# Patient Record
Sex: Female | Born: 1990 | Race: Black or African American | Hispanic: No | Marital: Single | Smoking: Current every day smoker
Health system: Southern US, Community
[De-identification: ages and names within clinical notes are randomized; demographics above are authoritative.]

## PROBLEM LIST (undated history)

## (undated) DIAGNOSIS — I10 Essential (primary) hypertension: Secondary | ICD-10-CM

---

## 2018-04-29 ENCOUNTER — Encounter (HOSPITAL_COMMUNITY): Payer: Self-pay | Admitting: *Deleted

## 2018-04-29 ENCOUNTER — Emergency Department (HOSPITAL_COMMUNITY)
Admission: EM | Admit: 2018-04-29 | Discharge: 2018-04-29 | Disposition: A | Discharge status: Home / Self Care | Payer: Self-pay | Attending: Emergency Medicine | Admitting: Emergency Medicine

## 2018-04-29 DIAGNOSIS — I10 Essential (primary) hypertension: Secondary | ICD-10-CM | POA: Insufficient documentation

## 2018-04-29 DIAGNOSIS — J069 Acute upper respiratory infection, unspecified: Secondary | ICD-10-CM | POA: Insufficient documentation

## 2018-04-29 HISTORY — DX: Essential (primary) hypertension: I10

## 2018-04-29 LAB — GROUP A STREP BY PCR: Group A Strep by PCR: NOT DETECTED

## 2018-04-29 MED ORDER — FLUTICASONE PROPIONATE 50 MCG/ACT NA SUSP: 1.0000 | Freq: Every day | NASAL | 0 refills | Status: DC

## 2018-04-29 MED ORDER — CLONIDINE HCL 0.2 MG PO TABS
0.2000 mg | ORAL_TABLET | Freq: Once | ORAL | Status: AC
  Administered 2018-04-29: 0.2 mg via ORAL
  Filled 2018-04-29: qty 1

## 2018-04-29 MED ORDER — BENZONATATE 100 MG PO CAPS: 100.0000 mg | ORAL_CAPSULE | Freq: Three times a day (TID) | ORAL | 0 refills | Status: DC | PRN

## 2018-04-29 MED ORDER — IBUPROFEN 800 MG PO TABS: 800.0000 mg | ORAL_TABLET | Freq: Three times a day (TID) | ORAL | 0 refills | Status: DC

## 2018-04-29 NOTE — Discharge Instructions (Addendum)
You were seen in the emergency today for upper respiratory symptoms, we suspect your symptoms are related to a virus at this time. Your strep test was negative. We have prescribed you multiple medications to treat your symptoms.   -Flonase to be used 1 spray in each nostril daily.  This medication is used to treat your congestion.  -Tessalon can be taken once every 8 hours as needed.  This medication is used to treat your cough.  -Ibuprofen to be taken once every 8 hours as needed for pain. Please take this medicine with food as it can cause stomach upset and at worst stomach bleeding. Do not take other NSAIDs such as motrin, aleve, advil, naproxen, mobic, etc as they are similar. You make take tylenol per over the counter dosing with this medicine safely.  We have prescribed you new medication(s) today. Discuss the medications prescribed today with your pharmacist as they can have adverse effects and interactions with your other medicines including over the counter and prescribed medications. Seek medical evaluation if you start to experience new or abnormal symptoms after taking one of these medicines, seek care immediately if you start to experience difficulty breathing, feeling of your throat closing, facial swelling, or rash as these could be indications of a more serious allergic reaction  You will need to follow-up with your primary care provider in 1 week if your symptoms have not improved.  If you do not have a primary care provider one is provided in your discharge instructions.  Return to the emergency department for any new or worsening symptoms including but not limited to persistent fever for 5 days, difficulty breathing, chest pain, rashes, passing out, or any other concerns.    Additionally your blood pressure was elevated in the ER- please take your hydrochlorothiazide as prescribed. Follow up with primary care within 1 week for recheck of this.  Return to the ER for new or worsening  symptoms including but not limited to acute worsening of headache, dizziness, numbness, weakness, change in vision, chest pain, or any other concerns.

## 2018-04-29 NOTE — ED Triage Notes (Signed)
Pt is here for sore throat x2 days.  Tonsils are enlarged.  Pt denies any fever or chills.  Pt is hypertensive in triage.  She is not taking her BP meds.  Pt denies any symtoms from her HTN

## 2018-04-29 NOTE — ED Notes (Signed)
Patient verbalizes understanding of discharge instructions. Opportunity for questioning and answers were provided. Armband removed by staff, pt discharged from ED ambulatory to home.  

## 2018-04-29 NOTE — ED Provider Notes (Signed)
[facility]   CSN: [id number] Arrival date & time: 04/29/18  1201     History   Chief Complaint Chief Complaint  Patient presents with  . Sore Throat  . Hypertension    HPI Bethany Ball is a 28 y.o. female with a hx of HTN who presents to the ED with complaints of sore throat and associated URI symptoms for the past 2 to 3 days.  Patient states she is having congestion, sore throat, some right ear discomfort, productive cough with mucus sputum.  Current discomfort is a 5 out of 10 in severity.  No specific alleviating or aggravating factors.  Has tried OTC cold/flu medicine without significant relief.  Denies fever, chills, chest pain, dyspnea, or vomiting.  Patient noted to be hypertensive upon arrival to the emergency department.  She states that she is prescribed hydrochlorothiazide.  She has not been taking it for several days as she "does not like to take her blood pressure medicine with cold medicine as it scares her."  States she has had some mild headache over the past few days, gradual onset, steady progression, similar to prior headaches.  She denies change in vision, numbness, tingling, weakness, dizziness, chest pain, dyspnea, or abdominal pain.  She states her blood pressure typically runs in the 200s over 100s.  HPI  Past Medical History:  Diagnosis Date  . Hypertension    does not take her meds that are prescribed    There are no active problems to display for this patient.   History reviewed. No pertinent surgical history.   OB History   No obstetric history on file.      Home Medications    Prior to Admission medications   Not on File    Family History No family history on file.  Social History Social History   Tobacco Use  . Smoking status: Not on file  . Smokeless tobacco: Never Used  Substance Use Topics  . Alcohol use: Never    Frequency: Never  . Drug use: Never      Allergies   Patient has no known allergies.   Review of Systems Review of Systems  Constitutional: Negative for chills and fever.  HENT: Positive for congestion, ear pain and sore throat. Negative for trouble swallowing.   Eyes: Negative for visual disturbance.  Respiratory: Positive for cough. Negative for shortness of breath.   Cardiovascular: Negative for chest pain.  Gastrointestinal: Negative for abdominal pain.  Neurological: Positive for headaches. Negative for dizziness, syncope, weakness and numbness.  All other systems reviewed and are negative.    Physical Exam Updated Vital Signs BP (!) 220/136 (BP Location: Left Arm)   Pulse 92   Temp 98.2 F (36.8 C) (Oral)   Resp 16   Ht 5\' 7"  (1.702 m)   Wt 108.9 kg   LMP 04/18/2018   SpO2 100%   BMI 37.59 kg/m   Physical Exam Vitals signs and nursing note reviewed.  Constitutional:      General: She is not in acute distress.    Appearance: She is well-developed.  HENT:     Head: Normocephalic and atraumatic.     Right Ear: Tympanic membrane normal. Tympanic membrane is not perforated, erythematous, retracted or bulging.     Left Ear: Tympanic membrane normal. Tympanic membrane is not perforated, erythematous, retracted or bulging.     Nose: No congestion.     Mouth/Throat:     Pharynx: Uvula midline. Posterior  oropharyngeal erythema present. No oropharyngeal exudate.     Tonsils: Swelling: 1+ on the right. 1+ on the left.     Comments: Posterior oropharynx is symmetric appearing. Patient tolerating own secretions without difficulty. No trismus. No drooling. No hot potato voice. No swelling beneath the tongue, submandibular compartment is soft.  Eyes:     General:        Right eye: No discharge.        Left eye: No discharge.     Conjunctiva/sclera: Conjunctivae normal.     Pupils: Pupils are equal, round, and reactive to light.  Neck:     Musculoskeletal: Normal range of motion and neck supple. No edema or  neck rigidity.  Cardiovascular:     Rate and Rhythm: Normal rate and regular rhythm.     Heart sounds: No murmur.  Pulmonary:     Effort: No respiratory distress.     Breath sounds: Normal breath sounds. No wheezing or rales.  Abdominal:     General: There is no distension.     Palpations: Abdomen is soft.     Tenderness: There is no abdominal tenderness.  Lymphadenopathy:     Cervical: No cervical adenopathy.  Skin:    General: Skin is warm and dry.     Findings: No rash.  Neurological:     Mental Status: She is alert.     Comments: Clear speech.  CN III through XII grossly intact.  Sensation grossly intact bilateral upper and lower extremities.  5 out of 5 symmetric grip strength.  Negative pronator drift.  Normal finger-to-nose.  Ambulatory with steady gait.  Psychiatric:        Behavior: Behavior normal.    ED Treatments / Results  Labs (all labs ordered are listed, but only abnormal results are displayed) Labs Reviewed  GROUP A STREP BY PCR    EKG None  Radiology No results found.  Procedures Procedures (including critical care time)  Medications Ordered in ED Medications - No data to display   Initial Impression / Assessment and Plan / ED Course  I have reviewed the triage vital signs and the nursing notes.  Pertinent labs & imaging results that were available during my care of the patient were reviewed by me and considered in my medical decision making (see chart for details).    Patient presents with URI type symptoms.  Patient is nontoxic appearing, in no apparent distress, vitals notable for hypertension. Patient has hx of HTN, not taking her prescribed hydrochlorothiazide. She has a mild headache similar to prior w/ no neuro deficits doubt ICH. No numbness, weakness, visual disturbance, dizziness, chest pain, or dyspnea. Doubt HTN emergency. Discussed with supervising physician Dr. Rodena Medin- recommend 0.2 mg of Clonidine- this was administered w/ improvement  in BP. Encouraged patient to take her hydrochlorothiazide as prescribed & follow up with PCP regarding this matter.   Regarding URI sxs: Patient is afebrile in the ED, lungs are CTA, doubt pneumonia. There is no wheezing or signs of respiratory distress. Sxs onset < 7 days, afebrile, no sinus tenderness, doubt acute bacterial sinusitis. Strep negative, no evidence of RPA/PTA. No evidence of AOM on exam. No meningeal signs.  Suspect viral, will tx supportively with  Ibuprofen, Flonase, and Tessalon. I discussed results, treatment plan, need for PCP follow-up, and return precautions with the patient. Provided opportunity for questions, patient confirmed understanding and is in agreement with plan.   Vitals:   04/29/18 1630 04/29/18 1748  BP: (!) 210/164 Marland Kitchen)  164/123  Pulse:  69  Resp:  16  Temp:    SpO2:  98%     Final Clinical Impressions(s) / ED Diagnoses   Final diagnoses:  Viral URI  Hypertension, unspecified type    ED Discharge Orders         Ordered    fluticasone (FLONASE) 50 MCG/ACT nasal spray  Daily,   Status:  Discontinued     04/29/18 1745    benzonatate (TESSALON) 100 MG capsule  3 times daily PRN,   Status:  Discontinued     04/29/18 1745    ibuprofen (ADVIL,MOTRIN) 800 MG tablet  3 times daily,   Status:  Discontinued     04/29/18 1745    benzonatate (TESSALON) 100 MG capsule  3 times daily PRN     04/29/18 1756    fluticasone (FLONASE) 50 MCG/ACT nasal spray  Daily     04/29/18 1756    ibuprofen (ADVIL,MOTRIN) 800 MG tablet  3 times daily     04/29/18 1756           Trea Latner, Antigo R, PA-C 04/29/18 1846    Wynetta Fines, MD 05/02/18 1525

## 2018-04-30 ENCOUNTER — Ambulatory Visit (HOSPITAL_COMMUNITY)
Admission: EM | Admit: 2018-04-30 | Discharge: 2018-04-30 | Disposition: A | Discharge status: Home / Self Care | Payer: Self-pay | Attending: Family Medicine | Admitting: Family Medicine

## 2018-04-30 ENCOUNTER — Encounter (HOSPITAL_COMMUNITY): Payer: Self-pay | Admitting: Emergency Medicine

## 2018-04-30 DIAGNOSIS — I10 Essential (primary) hypertension: Secondary | ICD-10-CM

## 2018-04-30 MED ORDER — AMLODIPINE BESYLATE 5 MG PO TABS: 5.0000 mg | ORAL_TABLET | Freq: Every day | ORAL | 2 refills | Status: DC

## 2018-04-30 MED ORDER — ACETAMINOPHEN 500 MG PO TABS: 500.0000 mg | ORAL_TABLET | Freq: Four times a day (QID) | ORAL | 0 refills | Status: DC | PRN

## 2018-04-30 MED ORDER — CLONIDINE HCL 0.1 MG PO TABS
0.2000 mg | ORAL_TABLET | Freq: Once | ORAL | Status: AC
  Administered 2018-04-30: 0.2 mg via ORAL

## 2018-04-30 MED ORDER — CLONIDINE HCL 0.1 MG PO TABS
ORAL_TABLET | ORAL | Status: AC
  Filled 2018-04-30: qty 2

## 2018-04-30 MED ORDER — CYCLOBENZAPRINE HCL 10 MG PO TABS: 10.0000 mg | ORAL_TABLET | Freq: Two times a day (BID) | ORAL | 0 refills | Status: DC | PRN
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
## 2021-06-12 ENCOUNTER — Encounter (HOSPITAL_COMMUNITY): Payer: Self-pay

## 2021-06-12 ENCOUNTER — Emergency Department (HOSPITAL_COMMUNITY): Payer: Self-pay

## 2021-06-12 ENCOUNTER — Inpatient Hospital Stay (HOSPITAL_COMMUNITY)
Admission: EM | Admit: 2021-06-12 | Discharge: 2021-06-15 | DRG: 683 | Disposition: A | Discharge status: Home / Self Care | Payer: Self-pay | Attending: Internal Medicine | Admitting: Internal Medicine

## 2021-06-12 ENCOUNTER — Other Ambulatory Visit: Payer: Self-pay

## 2021-06-12 DIAGNOSIS — Z8249 Family history of ischemic heart disease and other diseases of the circulatory system: Secondary | ICD-10-CM

## 2021-06-12 DIAGNOSIS — N189 Chronic kidney disease, unspecified: Secondary | ICD-10-CM | POA: Diagnosis present

## 2021-06-12 DIAGNOSIS — T447X6A Underdosing of beta-adrenoreceptor antagonists, initial encounter: Secondary | ICD-10-CM | POA: Diagnosis present

## 2021-06-12 DIAGNOSIS — H0520 Unspecified exophthalmos: Secondary | ICD-10-CM | POA: Diagnosis present

## 2021-06-12 DIAGNOSIS — I160 Hypertensive urgency: Secondary | ICD-10-CM

## 2021-06-12 DIAGNOSIS — T465X5A Adverse effect of other antihypertensive drugs, initial encounter: Secondary | ICD-10-CM | POA: Diagnosis not present

## 2021-06-12 DIAGNOSIS — I129 Hypertensive chronic kidney disease with stage 1 through stage 4 chronic kidney disease, or unspecified chronic kidney disease: Principal | ICD-10-CM | POA: Diagnosis present

## 2021-06-12 DIAGNOSIS — R42 Dizziness and giddiness: Secondary | ICD-10-CM | POA: Diagnosis not present

## 2021-06-12 DIAGNOSIS — Z560 Unemployment, unspecified: Secondary | ICD-10-CM

## 2021-06-12 DIAGNOSIS — E669 Obesity, unspecified: Secondary | ICD-10-CM | POA: Diagnosis present

## 2021-06-12 DIAGNOSIS — N179 Acute kidney failure, unspecified: Secondary | ICD-10-CM | POA: Diagnosis present

## 2021-06-12 DIAGNOSIS — Z91128 Patient's intentional underdosing of medication regimen for other reason: Secondary | ICD-10-CM

## 2021-06-12 DIAGNOSIS — Z9114 Patient's other noncompliance with medication regimen: Secondary | ICD-10-CM

## 2021-06-12 DIAGNOSIS — Y92230 Patient room in hospital as the place of occurrence of the external cause: Secondary | ICD-10-CM | POA: Diagnosis not present

## 2021-06-12 DIAGNOSIS — I10 Essential (primary) hypertension: Principal | ICD-10-CM | POA: Diagnosis present

## 2021-06-12 DIAGNOSIS — E250 Congenital adrenogenital disorders associated with enzyme deficiency: Secondary | ICD-10-CM | POA: Diagnosis present

## 2021-06-12 DIAGNOSIS — E785 Hyperlipidemia, unspecified: Secondary | ICD-10-CM | POA: Diagnosis present

## 2021-06-12 DIAGNOSIS — Z6834 Body mass index (BMI) 34.0-34.9, adult: Secondary | ICD-10-CM

## 2021-06-12 LAB — MAGNESIUM: Magnesium: 2 mg/dL (ref 1.7–2.4)

## 2021-06-12 LAB — COMPREHENSIVE METABOLIC PANEL
ALT: 14 U/L (ref 0–44)
AST: 19 U/L (ref 15–41)
Albumin: 4 g/dL (ref 3.5–5.0)
Alkaline Phosphatase: 69 U/L (ref 38–126)
Anion gap: 13 (ref 5–15)
BUN: 16 mg/dL (ref 6–20)
CO2: 23 mmol/L (ref 22–32)
Calcium: 9.6 mg/dL (ref 8.9–10.3)
Chloride: 103 mmol/L (ref 98–111)
Creatinine, Ser: 1.31 mg/dL — ABNORMAL HIGH (ref 0.44–1.00)
GFR, Estimated: 56 mL/min — ABNORMAL LOW (ref >60)
Glucose, Bld: 107 mg/dL — ABNORMAL HIGH (ref 70–99)
Potassium: 3.6 mmol/L (ref 3.5–5.1)
Sodium: 139 mmol/L (ref 135–145)
Total Bilirubin: 0.2 mg/dL — ABNORMAL LOW (ref 0.3–1.2)
Total Protein: 7.7 g/dL (ref 6.5–8.1)

## 2021-06-12 LAB — LIPID PANEL
Cholesterol: 229 mg/dL — ABNORMAL HIGH (ref 0–200)
HDL: 43 mg/dL (ref >40)
LDL Cholesterol: 156 mg/dL — ABNORMAL HIGH (ref 0–99)
Total CHOL/HDL Ratio: 5.3 RATIO
Triglycerides: 152 mg/dL — ABNORMAL HIGH (ref <150)
VLDL: 30 mg/dL (ref 0–40)

## 2021-06-12 LAB — CBC WITH DIFFERENTIAL/PLATELET
Abs Immature Granulocytes: 0.03 10*3/uL (ref 0.00–0.07)
Basophils Absolute: 0.1 10*3/uL (ref 0.0–0.1)
Basophils Relative: 1 %
Eosinophils Absolute: 0.2 10*3/uL (ref 0.0–0.5)
Eosinophils Relative: 2 %
HCT: 41.3 % (ref 36.0–46.0)
Hemoglobin: 12.8 g/dL (ref 12.0–15.0)
Immature Granulocytes: 0 %
Lymphocytes Relative: 25 %
Lymphs Abs: 2.4 10*3/uL (ref 0.7–4.0)
MCH: 27.9 pg (ref 26.0–34.0)
MCHC: 31 g/dL (ref 30.0–36.0)
MCV: 90.2 fL (ref 80.0–100.0)
Monocytes Absolute: 0.7 10*3/uL (ref 0.1–1.0)
Monocytes Relative: 8 %
Neutro Abs: 6.5 10*3/uL (ref 1.7–7.7)
Neutrophils Relative %: 64 %
Platelets: 303 10*3/uL (ref 150–400)
RBC: 4.58 MIL/uL (ref 3.87–5.11)
RDW: 14.3 % (ref 11.5–15.5)
WBC: 9.9 10*3/uL (ref 4.0–10.5)
nRBC: 0 % (ref 0.0–0.2)

## 2021-06-12 LAB — TROPONIN I (HIGH SENSITIVITY)
Troponin I (High Sensitivity): 15 ng/L (ref <18)
Troponin I (High Sensitivity): 16 ng/L (ref <18)

## 2021-06-12 LAB — HIV ANTIBODY (ROUTINE TESTING W REFLEX): HIV Screen 4th Generation wRfx: NONREACTIVE

## 2021-06-12 LAB — HEMOGLOBIN A1C
Hgb A1c MFr Bld: 5 % (ref 4.8–5.6)
Mean Plasma Glucose: 96.8 mg/dL

## 2021-06-12 LAB — TSH: TSH: 1.158 u[IU]/mL (ref 0.350–4.500)

## 2021-06-12 LAB — I-STAT BETA HCG BLOOD, ED (MC, WL, AP ONLY): I-stat hCG, quantitative: <5 m[IU]/mL (ref <5)

## 2021-06-12 LAB — PHOSPHORUS: Phosphorus: 4.3 mg/dL (ref 2.5–4.6)

## 2021-06-12 MED ORDER — ONDANSETRON HCL 4 MG PO TABS: 4.0000 mg | ORAL_TABLET | Freq: Four times a day (QID) | ORAL | Status: DC | PRN

## 2021-06-12 MED ORDER — LABETALOL HCL 5 MG/ML IV SOLN
20.0000 mg | Freq: Once | INTRAVENOUS | Status: AC
  Administered 2021-06-12: 20 mg via INTRAVENOUS
  Filled 2021-06-12: qty 4

## 2021-06-12 MED ORDER — LABETALOL HCL 5 MG/ML IV SOLN
10.0000 mg | INTRAVENOUS | Status: DC | PRN
  Administered 2021-06-12 (×2): 10 mg via INTRAVENOUS
  Administered 2021-06-12: 20 mg via INTRAVENOUS
  Administered 2021-06-12 – 2021-06-13 (×3): 10 mg via INTRAVENOUS
  Administered 2021-06-13: 20 mg via INTRAVENOUS
  Administered 2021-06-13 (×2): 10 mg via INTRAVENOUS
  Administered 2021-06-14: 15 mg via INTRAVENOUS
  Administered 2021-06-14: 20 mg via INTRAVENOUS
  Administered 2021-06-14 (×2): 10 mg via INTRAVENOUS
  Administered 2021-06-15: 20 mg via INTRAVENOUS
  Filled 2021-06-12 (×15): qty 4

## 2021-06-12 MED ORDER — ENOXAPARIN SODIUM 40 MG/0.4ML IJ SOSY
40.0000 mg | PREFILLED_SYRINGE | INTRAMUSCULAR | Status: DC
  Administered 2021-06-13 – 2021-06-14 (×2): 40 mg via SUBCUTANEOUS
  Filled 2021-06-12 (×3): qty 0.4

## 2021-06-12 MED ORDER — ACETAMINOPHEN 650 MG RE SUPP: 650.0000 mg | Freq: Four times a day (QID) | RECTAL | Status: DC | PRN

## 2021-06-12 MED ORDER — ONDANSETRON HCL 4 MG/2ML IJ SOLN: 4.0000 mg | Freq: Four times a day (QID) | INTRAMUSCULAR | Status: DC | PRN

## 2021-06-12 MED ORDER — LABETALOL HCL 5 MG/ML IV SOLN: 5.0000 mg | INTRAVENOUS | Status: DC | PRN

## 2021-06-12 MED ORDER — ACETAMINOPHEN 325 MG PO TABS
650.0000 mg | ORAL_TABLET | Freq: Four times a day (QID) | ORAL | Status: DC | PRN
  Administered 2021-06-12 – 2021-06-15 (×2): 650 mg via ORAL
  Filled 2021-06-12 (×2): qty 2

## 2021-06-12 MED ORDER — ACETAMINOPHEN 500 MG PO TABS
1000.0000 mg | ORAL_TABLET | Freq: Once | ORAL | Status: AC
  Administered 2021-06-12: 1000 mg via ORAL
  Filled 2021-06-12: qty 2

## 2021-06-12 MED ORDER — POLYETHYLENE GLYCOL 3350 17 G PO PACK: 17.0000 g | PACK | Freq: Every day | ORAL | Status: DC | PRN

## 2021-06-12 MED ORDER — HYDROCHLOROTHIAZIDE 25 MG PO TABS
25.0000 mg | ORAL_TABLET | Freq: Every day | ORAL | Status: DC
  Administered 2021-06-12 – 2021-06-15 (×4): 25 mg via ORAL
  Filled 2021-06-12 (×4): qty 1

## 2021-06-12 MED ORDER — SODIUM CHLORIDE 0.9% FLUSH
3.0000 mL | Freq: Two times a day (BID) | INTRAVENOUS | Status: DC
  Administered 2021-06-12 – 2021-06-15 (×7): 3 mL via INTRAVENOUS

## 2021-06-12 MED ORDER — AMLODIPINE BESYLATE 10 MG PO TABS
10.0000 mg | ORAL_TABLET | Freq: Every day | ORAL | Status: DC
  Administered 2021-06-12 – 2021-06-15 (×4): 10 mg via ORAL
  Filled 2021-06-12: qty 1
  Filled 2021-06-12: qty 2
  Filled 2021-06-12 (×2): qty 1

## 2021-06-12 NOTE — Hospital Course (Signed)
HTN emergency -  ?- frontal headache  ?- AKI  ?- mild elevated BNP ?- CT scan  ?- labetalol  ?-  ?

## 2021-06-12 NOTE — ED Triage Notes (Signed)
Went for new employment physical and was told her BP was too high for clearance and need ER treatment. 235/159. Hx HTN not taking any medications x 2 years. Denies any sx.  ?

## 2021-06-12 NOTE — Progress Notes (Signed)
Patient admitted with elevated BP. Will administer PRN meds and increase vital signs frequency ? ? 06/12/21 1745  ?Assess: MEWS Score  ?Temp 98.3 ?F (36.8 ?C)  ?BP (!) 210/132  ?Pulse Rate 79  ?Resp 18  ?SpO2 100 %  ?O2 Device Room Air  ?Assess: MEWS Score  ?MEWS Temp 0  ?MEWS Systolic 2  ?MEWS Pulse 0  ?MEWS RR 0  ?MEWS LOC 0  ?MEWS Score 2  ?MEWS Score Color Yellow  ?Assess: if the MEWS score is Yellow or Red  ?Were vital signs taken at a resting state? Yes  ?Focused Assessment No change from prior assessment  ?Early Detection of Sepsis Score *See Row Information* Low  ?MEWS guidelines implemented *See Row Information* Yes  ?Treat  ?Pain Scale 0-10  ?Pain Score 4  ?Pain Location Head  ?Pain Descriptors / Indicators Headache  ?Take Vital Signs  ?Increase Vital Sign Frequency  Yellow: Q 2hr X 2 then Q 4hr X 2, if remains yellow, continue Q 4hrs  ?Escalate  ?MEWS: Escalate Yellow: discuss with charge nurse/RN and consider discussing with provider and RRT  ?Notify: Charge Nurse/RN  ?Name of Charge Nurse/RN Notified French Ana RN  ?Date Charge Nurse/RN Notified 06/12/21  ?Time Charge Nurse/RN Notified 1748  ?Document  ?Patient Outcome Other (Comment)  ?Progress note created (see row info) Yes  ? ? ?

## 2021-06-12 NOTE — ED Provider Notes (Addendum)
?[facility] ?Provider Note ? ? ?CSN: [id number] ?Arrival date & time: 06/12/21  7253 ? ?  ? ?History ? ?Chief Complaint  ?Patient presents with  ? Hypertension  ? ? ?B[facility] is a 31 y.o. female. ? ?[facility] is a 31 yr old female with PMH of hypertension presents today with severely elevated blood pressures. She went for her employment physical yesterday and was told her BP was too high for clearance. Initial BP was 259/159. Patient reports not taking any antihypertensives for the last 2 years but has been prescribed amlodipine in the past prescribed by urgent care a few years ago, she never started it. She was taking "water pills" when she was young for HTN around age of 32. Has taken another medication starting "M" approx 10 years ago but stopped because she felt dizzy. Patient denies headache, vision, chest pain, drowsiness etc. Eating and drinking and passing urine well.   ? ? ?  ? ?Home Medications ?Prior to Admission medications   ?Medication Sig Start Date End Date Taking? Authorizing Provider  ?Aspirin-Salicylamide-Caffeine (BC HEADACHE PO) Take 1 packet by mouth daily as needed (headaches/pain).   Yes [provider]  ?amLODipine (NORVASC) 5 MG tablet Take 1 tablet (5 mg total) by mouth daily. ?Patient not taking: Reported on 06/12/2021 04/30/18   Bethany Aris, NP  ?   ? ?Allergies    ?Patient has no known allergies.   ? ?Review of Systems   ?Review of Systems  ?Constitutional: Negative.   ?HENT: Negative.    ?Eyes: Negative.   ?Respiratory: Negative.    ?Cardiovascular: Negative.   ?Gastrointestinal: Negative.   ?Endocrine: Negative.   ?Musculoskeletal: Negative.   ?Neurological: Negative.   ?Hematological: Negative.   ?Psychiatric/Behavioral: Negative.    ? ?Physical Exam ?Updated Vital Signs ?BP (!) 195/127   Pulse 64   Temp 98 ?F (36.7 ?C) (Oral)   Resp 15   Ht 5\' 7"  (1.702 m)   Wt 99.8 kg   SpO2 98%   BMI 34.46 kg/m?  ?Physical  Exam ?Constitutional:   ?   General: She is not in acute distress. ?   Appearance: Normal appearance. She is obese. She is not ill-appearing or toxic-appearing.  ?HENT:  ?   Head: Normocephalic and atraumatic.  ?   Nose: Nose normal.  ?   Mouth/Throat:  ?   Mouth: Mucous membranes are moist.  ?Eyes:  ?   Extraocular Movements: Extraocular movements intact.  ?   Pupils: Pupils are equal, round, and reactive to light.  ?Cardiovascular:  ?   Rate and Rhythm: Normal rate and regular rhythm.  ?   Heart sounds: Normal heart sounds, S1 normal and S2 normal.  ?Pulmonary:  ?   Effort: Pulmonary effort is normal.  ?   Breath sounds: Normal breath sounds.  ?Abdominal:  ?   General: Abdomen is flat. Bowel sounds are normal.  ?   Palpations: Abdomen is soft.  ?Musculoskeletal:  ?   Cervical back: Normal range of motion and neck supple.  ?Skin: ?   General: Skin is warm and dry.  ?Neurological:  ?   General: No focal deficit present.  ?   Mental Status: She is alert. Mental status is at baseline.  ?   Sensory: Sensation is intact.  ?   Motor: Motor function is intact.  ?Psychiatric:     ?   Mood and Affect: Mood normal.     ?   Behavior:  Behavior normal.  ? ? ?ED Results / Procedures / Treatments   ?Labs ?(all labs ordered are listed, but only abnormal results are displayed) ?Labs Reviewed  ?COMPREHENSIVE METABOLIC PANEL - Abnormal; Notable for the following components:  ?    Result Value  ? Glucose, Bld 107 (*)   ? Creatinine, Ser 1.31 (*)   ? Total Bilirubin 0.2 (*)   ? GFR, Estimated 56 (*)   ? All other components within normal limits  ?CBC WITH DIFFERENTIAL/PLATELET  ?I-STAT BETA HCG BLOOD, ED ([facility], [facility], [facility] ONLY)  ?TROPONIN I (HIGH SENSITIVITY)  ?TROPONIN I (HIGH SENSITIVITY)  ? ? ?EKG ?None ? ?Radiology ?No results found. ? ?Procedures ?Procedures  ? ? ?Medications Ordered in ED ?Medications  ?hydrochlorothiazide (HYDRODIURIL) tablet 25 mg (25 mg Oral Given 06/12/21 1152)  ?labetalol (NORMODYNE) injection 5 mg (has no  administration in time range)  ?labetalol (NORMODYNE) injection 20 mg (20 mg Intravenous Given 06/12/21 1045)  ?labetalol (NORMODYNE) injection 20 mg (20 mg Intravenous Given 06/12/21 1152)  ?acetaminophen (TYLENOL) tablet 1,000 mg (1,000 mg Oral Given 06/12/21 1202)  ? ? ?ED Course/ Medical Decision Making/ A&P ?  ?                        ?Medical Decision Making ?[facility] is a 31 yr old female with PMH of hypertension presents today with severely elevated blood pressures and currently asymptomatic.  BP on admission was 261/169, heart rate 120. Cr 1.31(unclear baseline) glucose 107.  Patient initially received labetalol 20 mg and BP came down to 214/130. ? ?11:50 AM: Discussed with patient regarding inpatient hospitalization versus discharging with close PCP follow-up. She does now endorse a frontal and retro-orbital headache. I will treat with Tylenol 1000 mg and give further dose of IV labetalol 20 mg and HCTZ 25mg . Discussed with Dr who agreed that we should order CT head wo contrast and will likely need admission.  ? ?12:28: Referred to Dr Siri Cole medicine  ? ?Amount and/or Complexity of Data Reviewed ?External Data Reviewed: labs. ?Labs: ordered. ?Radiology: ordered. ?ECG/medicine tests: ordered. ? ?Risk ?OTC drugs. ?Prescription drug management. ? ? ? ? ? ? ? ? ? ?Final Clinical Impression(s) / ED Diagnoses ?Final diagnoses:  ?None  ? ? ?Rx / DC Orders ?ED Discharge Orders   ? ? None  ? ?  ? ? ?  ?Julian Hy, MD ?06/12/21 1233 ? ?  ?08/12/21, MD ?06/12/21 1233 ? ?  ?08/12/21, MD ?06/12/21 1510 ? ?

## 2021-06-12 NOTE — H&P (Signed)
? ?Date: 06/12/2021     ?Patient Name:  Leaner Morici MRN: [id number]  ?DOB: [birth year] Age / Sex: 31 y.o., female   ?PCP: Pcp, No    ?     ?Medical Service: Internal Medicine Teaching Service  ?     ?Attending Physician: Dr. Mayford Knife, Dorene Ar, MD    ?Intern (1st Contact): Ilene Qua, MD Pager: AD [phone]  ?Resident (2nd Contact): Merrilyn Puma, MD Pager: Cecilie Kicks [phone]  ?     ?After Hours (After 5p/  First Contact Pager: [phone]  ?weekends / holidays): Second Contact Pager: [phone]  ? ?SUBJECTIVE:  ?Chief Complaint: Severe controlled HTN ? ?History of Present Illness: Sehar Sedano is a functionally independent 31 y.o. female with a pertinent PMH of HTN, who presents to [facility] with uncontrolled hypertension.  Patient presented to a preemployment medical screening and was found to have significantly elevated blood pressure.  She was sent to [facility] emergency department for further evaluation and management.  On arrival patient had an elevated systolic blood pressure in the 250s with diastolics in the 160s.  Patient initially was asymptomatic but developed some presyncopal symptoms such as dizziness after receiving IV BP medications.  She states that she has a longstanding history of hypertension was diagnosed when she was 31 years old.  She she states that she was put on hydrochlorothiazide at that time and was transitioned to metoprolol at 31 years old .  She states that she did not feel well while on this medication and self discontinued it.  She has been off of antihypertensive medications for some time now without any notable symptoms.  All of her review of systems are negative at this time. ? ?Medications: ?No current facility-administered medications on file prior to encounter.  ? ?Current Outpatient Medications on File Prior to Encounter  ?Medication Sig Dispense Refill  ? Aspirin-Salicylamide-Caffeine (BC HEADACHE PO) Take 1 packet by mouth daily as needed (headaches/pain).    ? amLODipine  (NORVASC) 5 MG tablet Take 1 tablet (5 mg total) by mouth daily. (Patient not taking: Reported on 06/12/2021) 30 tablet 2  ? ? ?Past Medical History: ?Past Medical History:  ?Diagnosis Date  ? Hypertension   ? does not take her meds that are prescribed  ? ? ?Social:  ?Lives - [address] ?[place],  [place] [address], parents ?Support - Good ?Level of function: Independent ?Occupation - unemployed ?PCP - Pcp, No ?Substance use: ?Nonprescription/Illicit - denied.  ?ETOH - social drinker ?Tobacco - Smoked 1/4 packs per day for 10 years ? ?Family History: ?Dad - 2x CVA, HTN ?Mom - HTN,  ?GMA on mom side - DM, HTN ?GMA on dad side - DM, HTN ? ?Allergies: ?Allergies as of 06/12/2021  ? (No Known Allergies)  ? ? ?Review of Systems: ?A complete ROS was negative except as per HPI.  ? ?OBJECTIVE:  ?Physical Exam: ?Blood pressure (!) 181/113, pulse 67, temperature 98 ?F (36.7 ?C), temperature source Oral, resp. rate 17, height 5\' 7"  (1.702 m), weight 99.8 kg, SpO2 100 %. ?Physical Exam ?Constitutional:   ?   Appearance: Normal appearance.  ?HENT:  ?   Head: Normocephalic and atraumatic.  ?Eyes:  ?   Extraocular Movements: Extraocular movements intact.  ?   Comments: Exophthalmos present without ptosis   ?Neck:  ?   Thyroid: No thyroid mass, thyromegaly or thyroid tenderness.  ?Cardiovascular:  ?   Rate and Rhythm: Normal rate and regular rhythm.  ?   Pulses: Normal pulses.  ?  Heart sounds: Normal heart sounds.  ?Pulmonary:  ?   Effort: Pulmonary effort is normal.  ?   Breath sounds: Normal breath sounds.  ?Abdominal:  ?   General: Bowel sounds are normal. There is no distension.  ?   Palpations: Abdomen is soft.  ?   Tenderness: There is no abdominal tenderness.  ?Musculoskeletal:     ?   General: No swelling or tenderness. Normal range of motion.  ?   Cervical back: Normal range of motion.  ?   Right lower leg: No edema.  ?   Left lower leg: No edema.  ?Skin: ?   General: Skin is warm and dry.  ?Neurological:  ?   General: No  focal deficit present.  ?   Mental Status: She is alert and oriented to person, place, and time. Mental status is at baseline.  ?Psychiatric:     ?   Mood and Affect: Mood normal.  ? ? ?Pertinent Labs: ?CBC ?   ?Component Value Date/Time  ? WBC 9.9 06/12/2021 0655  ? RBC 4.58 06/12/2021 0655  ? HGB 12.8 06/12/2021 0655  ? HCT 41.3 06/12/2021 0655  ? PLT 303 06/12/2021 0655  ? MCV 90.2 06/12/2021 0655  ? MCH 27.9 06/12/2021 0655  ? MCHC 31.0 06/12/2021 0655  ? RDW 14.3 06/12/2021 0655  ? LYMPHSABS 2.4 06/12/2021 0655  ? MONOABS 0.7 06/12/2021 0655  ? EOSABS 0.2 06/12/2021 0655  ? BASOSABS 0.1 06/12/2021 0655  ?  ? ?CMP  ?   ?Component Value Date/Time  ? NA 139 06/12/2021 0655  ? K 3.6 06/12/2021 0655  ? CL 103 06/12/2021 0655  ? CO2 23 06/12/2021 0655  ? GLUCOSE 107 (H) 06/12/2021 0655  ? BUN 16 06/12/2021 0655  ? CREATININE 1.31 (H) 06/12/2021 4315  ? CALCIUM 9.6 06/12/2021 0655  ? PROT 7.7 06/12/2021 0655  ? ALBUMIN 4.0 06/12/2021 0655  ? AST 19 06/12/2021 0655  ? ALT 14 06/12/2021 0655  ? ALKPHOS 69 06/12/2021 0655  ? BILITOT 0.2 (L) 06/12/2021 4008  ? GFRNONAA 56 (L) 06/12/2021 0655  ? ? ?Pertinent Imaging: ?CT HEAD WO CONTRAST ( ) ?Result Date: 06/12/2021 ?FINDINGS: Brain: No evidence of acute infarction, hemorrhage, hydrocephalus, extra-axial collection or mass lesion/mass effect. Vascular: No hyperdense vessel or unexpected calcification. Skull: Normal. Negative for fracture or focal lesion. Sinuses/Orbits: No acute finding. Other: None.  ?IMPRESSION: No acute intracranial abnormality seen.  ? ?EKG: pending ? ?ASSESSMENT & PLAN:  ?Patient Summary: ?Loraina Stauffer is a functionally independent 31 y.o. female with a pertinent PMH of HTN, who presents to [facility] with uncontrolled hypertension and admit for severe, symptomatic HTN.  ? ?Assessment: ?Principal Problem: ?  Severe uncontrolled hypertension ? ? ?Plan: ?Severe Symptomatic HTN:  ?Presents with severe symptomatic hypertension with evidence of an AKI with a  creatinine of 1.31. Patient has had long standing HTN, which has never been formally evaluated. Will start antihypertensive medications and assess secondary causes of HTN. BP goal will be 25% reduction and therefore, 180/110 mm Hg. ?-Hydrochlorothiazide 25 mg daily ?-Amlodipine 10 mg daily ?-Labetalol 10 to 20 mg as needed for SBP greater than 180 and/or DBP greater than 110, hold if heart rate less than 70 ?-Renal artery duplex ultrasound ?-TSH pending ?-Plasma aldosterone and renin pending tomorrow morning ?-Denies any high risk features for OSA ?-A1c pending ?-Lipid panel pending ? ?AKI: ?Presents with presumably an elevated creatinine of 1.31 and a GFR 56 in the setting of severe symptomatic hypertension.  BUN/creatinine ratio  of 12.2 indicates likely intrarenal azotemia. ?-Monitor kidney function daily ?-Avoid nephrotoxic medications ?-UA pending ? ?Exophthalmos:  ?- TSH pending  ?- Denies any symptoms of hyperthyroidism  ? ? ? ?Best Practice: ?Diet: Cardiac diet ?IVF: none ?VTE: enoxaparin (LOVENOX) injection 40 mg Start: 06/12/21 1500 ?Code: Full Code ?AB: none ?Dispo: Observation with expected length of stay less than 2 midnights. ?Anticipated Discharge Location: Home ?Barriers to Discharge: Medical stability ?Family Contact: family, to be notified by patient. Cousin notified in the room. ? ?Signature: ?Chari Manning, D.O.  ?Internal Medicine Resident, PGY-3 ?[facility]  ?Pager #: [phone] (If no response, contact on-call pager) ?3:03 PM, 06/12/2021  ? ?Please contact the on-call pager after 5 pm and on weekends at [phone].  ?

## 2021-06-13 ENCOUNTER — Observation Stay (HOSPITAL_COMMUNITY): Payer: Self-pay

## 2021-06-13 DIAGNOSIS — I10 Essential (primary) hypertension: Secondary | ICD-10-CM

## 2021-06-13 DIAGNOSIS — I160 Hypertensive urgency: Secondary | ICD-10-CM

## 2021-06-13 LAB — URINALYSIS, COMPLETE (UACMP) WITH MICROSCOPIC
Bilirubin Urine: NEGATIVE
Glucose, UA: NEGATIVE mg/dL
Hgb urine dipstick: NEGATIVE
Ketones, ur: NEGATIVE mg/dL
Leukocytes,Ua: NEGATIVE
Nitrite: POSITIVE — AB
Protein, ur: NEGATIVE mg/dL
Specific Gravity, Urine: 1.006 (ref 1.005–1.030)
pH: 6 (ref 5.0–8.0)

## 2021-06-13 LAB — COMPREHENSIVE METABOLIC PANEL
ALT: 12 U/L (ref 0–44)
AST: 14 U/L — ABNORMAL LOW (ref 15–41)
Albumin: 3.7 g/dL (ref 3.5–5.0)
Alkaline Phosphatase: 65 U/L (ref 38–126)
Anion gap: 10 (ref 5–15)
BUN: 14 mg/dL (ref 6–20)
CO2: 24 mmol/L (ref 22–32)
Calcium: 9.2 mg/dL (ref 8.9–10.3)
Chloride: 102 mmol/L (ref 98–111)
Creatinine, Ser: 1.17 mg/dL — ABNORMAL HIGH (ref 0.44–1.00)
GFR, Estimated: >60 mL/min (ref >60)
Glucose, Bld: 108 mg/dL — ABNORMAL HIGH (ref 70–99)
Potassium: 3 mmol/L — ABNORMAL LOW (ref 3.5–5.1)
Sodium: 136 mmol/L (ref 135–145)
Total Bilirubin: 0.4 mg/dL (ref 0.3–1.2)
Total Protein: 7 g/dL (ref 6.5–8.1)

## 2021-06-13 LAB — CBC
HCT: 37 % (ref 36.0–46.0)
Hemoglobin: 12.1 g/dL (ref 12.0–15.0)
MCH: 28.1 pg (ref 26.0–34.0)
MCHC: 32.7 g/dL (ref 30.0–36.0)
MCV: 86 fL (ref 80.0–100.0)
Platelets: 289 10*3/uL (ref 150–400)
RBC: 4.3 MIL/uL (ref 3.87–5.11)
RDW: 14.4 % (ref 11.5–15.5)
WBC: 9.1 10*3/uL (ref 4.0–10.5)
nRBC: 0 % (ref 0.0–0.2)

## 2021-06-13 LAB — GLUCOSE, CAPILLARY: Glucose-Capillary: 111 mg/dL — ABNORMAL HIGH (ref 70–99)

## 2021-06-13 MED ORDER — SPIRONOLACTONE 25 MG PO TABS
25.0000 mg | ORAL_TABLET | Freq: Every day | ORAL | Status: DC
  Administered 2021-06-13: 25 mg via ORAL
  Filled 2021-06-13: qty 1

## 2021-06-13 MED ORDER — POTASSIUM CHLORIDE CRYS ER 20 MEQ PO TBCR
40.0000 meq | EXTENDED_RELEASE_TABLET | Freq: Two times a day (BID) | ORAL | Status: AC
  Administered 2021-06-13 (×2): 40 meq via ORAL
  Filled 2021-06-13 (×2): qty 2

## 2021-06-13 NOTE — Progress Notes (Signed)
HD#0 Subjective:  Overnight Events: NAEO   Patient says that she is feeling better today. Has not felt lightheaded or weak since getting to the ER yesterday when she was first given medications. Endorses a history of irregular periods but that they have been more regular lately.  Objective:  Vital signs in last 24 hours: Vitals:   06/13/21 0443 06/13/21 0744 06/13/21 1145 06/13/21 1330  BP:  (!) 186/116 (!) 189/122 (!) 196/125  Pulse:  65 76   Resp:  18 16   Temp:  98 F (36.7 C) 98.5 F (36.9 C)   TempSrc:  Oral Oral   SpO2:  97% 96%   Weight: 98.5 kg     Height:       Supplemental O2: Room Air SpO2: 96 %   Physical Exam:  Constitutional: young woman resting comfortably in bed, in no acute distress HEENT: mucous membranes moist, conjunctiva non-erythematous, increased terminal hair growth over the chin Cardiovascular: regular rate and rhythm, no m/r/g Pulmonary/Chest: normal work of breathing on room air, lungs clear to auscultation bilaterally Abdominal: soft, non-tender, non-distended MSK: normal bulk and tone Neurological: alert & oriented x 3, answering questions appropriately Skin: warm and dry Psych: normal affect  Filed Weights   06/12/21 0647 06/13/21 0443  Weight: 99.8 kg 98.5 kg     Intake/Output Summary (Last 24 hours) at 06/13/2021 1358 Last data filed at 06/12/2021 2136 Gross per 24 hour  Intake 6 ml  Output --  Net 6 ml   Net IO Since Admission: 6 mL [06/13/21 1358]  Pertinent Labs: CBC Latest Ref Rng & Units 06/13/2021 06/12/2021  WBC 4.0 - 10.5 K/uL 9.1 9.9  Hemoglobin 12.0 - 15.0 g/dL 63.8 46.6  Hematocrit 59.9 - 46.0 % 37.0 41.3  Platelets 150 - 400 K/uL 289 303    CMP Latest Ref Rng & Units 06/13/2021 06/12/2021  Glucose 70 - 99 mg/dL 357(S) 177(L)  BUN 6 - 20 mg/dL 14 16  Creatinine 3.90 - 1.00 mg/dL 3.00(P) 2.33(A)  Sodium 135 - 145 mmol/L 136 139  Potassium 3.5 - 5.1 mmol/L 3.0(L) 3.6  Chloride 98 - 111 mmol/L 102 103  CO2 22 - 32  mmol/L 24 23  Calcium 8.9 - 10.3 mg/dL 9.2 9.6  Total Protein 6.5 - 8.1 g/dL 7.0 7.7  Total Bilirubin 0.3 - 1.2 mg/dL 0.4 0.7(M)  Alkaline Phos 38 - 126 U/L 65 69  AST 15 - 41 U/L 14(L) 19  ALT 0 - 44 U/L 12 14    Imaging: VAS US RENAL ARTERY DUPLEX  Result Date: 06/13/2021 ABDOMINAL VISCERAL Patient Name:  [facility]  Date of Exam:   06/13/2021 Medical Rec #: [id number]           Accession #:    [id number] Date of Birth: 27-Oct-1990           Patient Gender: F Patient Age:   46 years Exam Location:  [facility] Procedure:      VAS US RENAL ARTERY DUPLEX Referring Phys: [id number] JOSHUA ZAVITZ -------------------------------------------------------------------------------- Indications: Uncontrolled hypertension High Risk Factors: Hypertension. Limitations: Air/bowel gas. Comparison Study: No prior. Performing Technologist: Marilynne Halsted RDMS, RVT  Examination Guidelines: A complete evaluation includes B-mode imaging, spectral Doppler, color Doppler, and power Doppler as needed of all accessible portions of each vessel. Bilateral testing is considered an integral part of a complete examination. Limited examinations for reoccurring indications may be performed as noted.  Duplex Findings: +--------------------+--------+--------+------+--------+  Mesenteric  PSV cm/s EDV cm/s Plaque Comments  +--------------------+--------+--------+------+--------+  Aorta Prox             100                              +--------------------+--------+--------+------+--------+  Celiac Artery Origin   186                              +--------------------+--------+--------+------+--------+  SMA Proximal           184                              +--------------------+--------+--------+------+--------+    +------------------+--------+--------+-------+  Right Renal Artery PSV cm/s EDV cm/s Comment  +------------------+--------+--------+-------+  Origin                83       28              +------------------+--------+--------+-------+  Proximal              89       27             +------------------+--------+--------+-------+  Mid                  112       44             +------------------+--------+--------+-------+  Distal                97       27             +------------------+--------+--------+-------+ +-----------------+--------+--------+-------+  Left Renal Artery PSV cm/s EDV cm/s Comment  +-----------------+--------+--------+-------+  Origin               95       31             +-----------------+--------+--------+-------+  Proximal            126       43             +-----------------+--------+--------+-------+  Mid                 108       33             +-----------------+--------+--------+-------+  Distal               97       35             +-----------------+--------+--------+-------+ +------------+--------+--------+----+-----------+--------+--------+----+  Right Kidney PSV cm/s EDV cm/s RI   Left Kidney PSV cm/s EDV cm/s RI    +------------+--------+--------+----+-----------+--------+--------+----+  Upper Pole   22       8        0.62 Upper Pole  16       8        0.49  +------------+--------+--------+----+-----------+--------+--------+----+  Mid          27       13       0.53 Mid         34       13       0.62  +------------+--------+--------+----+-----------+--------+--------+----+  Lower Pole   35       14       0.59 Lower Pole  26       10       0.62  +------------+--------+--------+----+-----------+--------+--------+----+  Hilar        49       19       0.62 Hilar       52       19       0.64  +------------+--------+--------+----+-----------+--------+--------+----+ +------------------+-----+------------------+-----+  Right Kidney             Left Kidney               +------------------+-----+------------------+-----+  RAR                      RAR                       +------------------+-----+------------------+-----+  RAR (manual)       0.83  RAR (manual)       0.95    +------------------+-----+------------------+-----+  Cortex                   Cortex                    +------------------+-----+------------------+-----+  Cortex thickness         Corex thickness           +------------------+-----+------------------+-----+  Kidney length (cm) 10.59 Kidney length (cm) 10.85  +------------------+-----+------------------+-----+   Summary: Renal:  Right: No evidence of right renal artery stenosis. Normal right        Resisitive Index. Normal size right kidney. Left:  No evidence of left renal artery stenosis. Normal left        Resistive Index. Normal size of left kidney.  *See table(s) above for measurements and observations.     Preliminary     Assessment/Plan:   Principal Problem:   Severe uncontrolled hypertension   Patient Summary: Bethany Ball is a 31 y.o.  with a pertinent PMH of HTN, who presents to [facility] with uncontrolled hypertension and admit for severe, symptomatic HTN.    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 Renal artery ultrasound was normal. Given the patient's history of hirsuitism, irregular periods, and elevated blood pressure from a very young age I think it is reasonable to rule out non-classical adrenal hyperplasia which can present in this way. Given patient has a possible AKI from an unclear baseline holding off on ACE/ARB at this time. - morning 17 hydroxyprogesterone level, if >200 ng/dL (6 nmol/L) will proceed with ACTH stim test - HCTZ 25 mg daily, Amlodipine 10 mg daily  - consider adding spironolactone -Labetalol 10 to 20 mg as needed for SBP greater than 180 and/or DBP greater than 110, hold if heart rate less than 70 -f/u Plasma aldosterone and renin pending tomorrow morning -A1c normal, elevated lipid panel   AKI: improving Presents with presumably an elevated  creatinine of 1.31 and a GFR 56 in the setting of severe symptomatic hypertension. Creatinine has improved to 1.17 today. Consider ACE/Arb if patient is deemed to have preexisting renal disease once possible AKI is resolved -Avoid nephrotoxic medications -daily bmp  Obesity, Hyperlipidemia LDL elevated to 156, will benefit from dietary counseling in the outpatient setting  Diet: Normal IVF: None VTE: Enoxaparin Code: Full  Prior to Admission Living Arrangement: Home Anticipated Discharge Location: Home Barriers  to Discharge: continued medical workup Dispo: Anticipated discharge to Home in 1 days pending BP control.   Ilene QuaAlexa Nonna Renninger, MD Internal Medicine Resident PGY-1 Pager [phone] Please contact the on call pager after 5 pm and on weekends at [phone].

## 2021-06-13 NOTE — Plan of Care (Signed)
?  Problem: Education: ?Goal: Knowledge of General Education information will improve ?Description: Including pain rating scale, medication(s)/side effects and non-pharmacologic comfort measures ?Outcome: Progressing ?  ?Problem: Health Behavior/Discharge Planning: ?Goal: Ability to manage health-related needs will improve ?Outcome: Progressing ?  ?Problem: Clinical Measurements: ?Goal: Ability to maintain clinical measurements within normal limits will improve ?Outcome: Not Progressing ?Goal: Will remain free from infection ?Outcome: Progressing ?Goal: Diagnostic test results will improve ?Outcome: Progressing ?Goal: Respiratory complications will improve ?Outcome: Progressing ?Goal: Cardiovascular complication will be avoided ?Outcome: Progressing ?  ?Problem: Activity: ?Goal: Risk for activity intolerance will decrease ?Outcome: Progressing ?  ?Problem: Nutrition: ?Goal: Adequate nutrition will be maintained ?Outcome: Progressing ?  ?Problem: Coping: ?Goal: Level of anxiety will decrease ?Outcome: Progressing ?  ?Problem: Elimination: ?Goal: Will not experience complications related to bowel motility ?Outcome: Progressing ?  ?Problem: Pain Managment: ?Goal: General experience of comfort will improve ?Outcome: Progressing ?  ?Problem: Safety: ?Goal: Ability to remain free from injury will improve ?Outcome: Progressing ?  ?Problem: Skin Integrity: ?Goal: Risk for impaired skin integrity will decrease ?Outcome: Progressing ?  ?

## 2021-06-13 NOTE — Progress Notes (Signed)
Renal duplex  has been completed. Refer to [facility] under chart review to view preliminary results.  ? ?06/13/2021  10:19 AM ?Marilynne Halsted D ? ? ?

## 2021-06-13 NOTE — TOC Initial Note (Signed)
Transition of Care (TOC) - Initial/Assessment Note  ? ? ?Patient Details  ?Name: [facility] ?MRN: [id number] ?Date of Birth: November 15, 1990 ? ?Transition of Care (TOC) CM/SW Contact:    ?Pollie Friar, RN ?Phone Number: ?06/13/2021, 1:42 PM ? ?Clinical Narrative:                 ?Patient is from home with her cousin. She was not taking any medications prior to admission. She denies any issues with transportation.  ?No PCP. CM met with the patient and she was interested in getting an appointment with one of the [facility]. CM has obtained an appointment and placed it on the AVS.  ?Pt has transportation home once medically ready for d/c.  ? ?Expected Discharge Plan: Home/Self Care ?Barriers to Discharge: Continued Medical Work up, Inadequate or no insurance ? ? ?Patient Goals and CMS Choice ?  ?  ?  ? ?Expected Discharge Plan and Services ?Expected Discharge Plan: Home/Self Care ?  ?Discharge Planning Services: CM Consult ?  ?Living arrangements for the past 2 months: [place] ?                ?  ?  ?  ?  ?  ?  ?  ?  ?  ?  ? ?Prior Living Arrangements/Services ?Living arrangements for the past 2 months: [place] ?Lives with:: Relatives ?Patient language and need for interpreter reviewed:: Yes ?Do you feel safe going back to the place where you live?: Yes      ?  ?  ?  ?Criminal Activity/Legal Involvement Pertinent to Current Situation/Hospitalization: No - Comment as needed ? ?Activities of Daily Living ?Home Assistive Devices/Equipment: None ?ADL Screening (condition at time of admission) ?Patient's cognitive ability adequate to safely complete daily activities?: Yes ?Is the patient deaf or have difficulty hearing?: No ?Does the patient have difficulty seeing, even when wearing glasses/contacts?: No ?Does the patient have difficulty concentrating, remembering, or making decisions?: No ?Patient able to express need for assistance with ADLs?: Yes ?Does the patient have difficulty dressing or  bathing?: No ?Independently performs ADLs?: Yes (appropriate for developmental age) ?Does the patient have difficulty walking or climbing stairs?: No ?Weakness of Legs: None ?Weakness of Arms/Hands: None ? ?Permission Sought/Granted ?  ?  ?   ?   ?   ?   ? ?Emotional Assessment ?Appearance:: Appears stated age ?Attitude/Demeanor/Rapport: Engaged ?Affect (typically observed): Accepting ?Orientation: : Oriented to Self, Oriented to Place, Oriented to  Time, Oriented to Situation ?  ?Psych Involvement: No (comment) ? ?Admission diagnosis:  Severe uncontrolled hypertension [I10] ?Hypertensive urgency [I16.0] ?Patient Active Problem List  ? Diagnosis Date Noted  ? Severe uncontrolled hypertension 06/12/2021  ? ?PCP:  Pcp, No ?Pharmacy:   ?[facility] [place] - [address] ?[address] ?[place] [address] ?Phone: [phone] Fax: [phone] ? ? ? ? ?Social Determinants of Health (SDOH) Interventions ?  ? ?Readmission Risk Interventions ?No flowsheet data found. ? ? ?

## 2021-06-13 NOTE — Progress Notes (Signed)
Medicated prn for elevated BP; patient reports numbness on the tip of her tongue; denies any headache or dizziness. ?

## 2021-06-14 LAB — BASIC METABOLIC PANEL
Anion gap: 8 (ref 5–15)
BUN: 14 mg/dL (ref 6–20)
CO2: 23 mmol/L (ref 22–32)
Calcium: 9.3 mg/dL (ref 8.9–10.3)
Chloride: 104 mmol/L (ref 98–111)
Creatinine, Ser: 1.1 mg/dL — ABNORMAL HIGH (ref 0.44–1.00)
GFR, Estimated: >60 mL/min (ref >60)
Glucose, Bld: 98 mg/dL (ref 70–99)
Potassium: 3.6 mmol/L (ref 3.5–5.1)
Sodium: 135 mmol/L (ref 135–145)

## 2021-06-14 LAB — PROTEIN / CREATININE RATIO, URINE
Creatinine, Urine: 97.71 mg/dL
Protein Creatinine Ratio: 0.15 mg/mg{Cre} (ref 0.00–0.15)
Total Protein, Urine: 15 mg/dL

## 2021-06-14 MED ORDER — HYDRALAZINE HCL 25 MG PO TABS
25.0000 mg | ORAL_TABLET | Freq: Three times a day (TID) | ORAL | Status: DC
Start: 2021-06-14 — End: 2021-06-15
  Administered 2021-06-14 – 2021-06-15 (×3): 25 mg via ORAL
  Filled 2021-06-14 (×3): qty 1

## 2021-06-14 MED ORDER — HYDRALAZINE HCL 10 MG PO TABS
10.0000 mg | ORAL_TABLET | Freq: Three times a day (TID) | ORAL | Status: DC
Start: 2021-06-14 — End: 2021-06-14
  Administered 2021-06-14 (×2): 10 mg via ORAL
  Filled 2021-06-14 (×2): qty 1

## 2021-06-14 MED ORDER — SPIRONOLACTONE 25 MG PO TABS
25.0000 mg | ORAL_TABLET | Freq: Every day | ORAL | Status: DC
  Administered 2021-06-14 – 2021-06-15 (×2): 25 mg via ORAL
  Filled 2021-06-14 (×2): qty 1

## 2021-06-14 MED ORDER — SPIRONOLACTONE 25 MG PO TABS
50.0000 mg | ORAL_TABLET | Freq: Every day | ORAL | Status: DC
Start: 2021-06-14 — End: 2021-06-14

## 2021-06-14 NOTE — Progress Notes (Signed)
? ? ?HD#1 ?Subjective:  ?Overnight Events: NAEO ? ? Patient says that she is feeling fine today. She denies any blurry vision, headache, or dizziness. She has no new complaints. ? ?Objective:  ?Vital signs in last 24 hours: ?Vitals:  ? 06/14/21 0428 06/14/21 0451 06/14/21 0537 06/14/21 0803  ?BP:  (!) 201/123 (!) 186/112 (!) 179/122  ?Pulse:  79 73 67  ?Resp:  20  16  ?Temp:  98.4 ?F (36.9 ?C)  98.8 ?F (37.1 ?C)  ?TempSrc:  Oral  Oral  ?SpO2:  99%  98%  ?Weight: 98 kg     ?Height:      ? ?Supplemental O2: Room Air ?SpO2: 98 % ? ? ?Physical Exam:  ?Constitutional: young woman resting comfortably in bed, in no acute distress ?HEENT: mucous membranes moist, conjunctiva non-erythematous, increased terminal hair growth over the chin ?Cardiovascular: regular rate and rhythm, no m/r/g ?Pulmonary/Chest: normal work of breathing on room air, lungs clear to auscultation bilaterally ?Abdominal: soft, non-tender, non-distended ?MSK: normal bulk and tone ?Neurological: alert & oriented x 3, answering questions appropriately ?Skin: warm and dry ?Psych: normal affect ?  ? ?Filed Weights  ? 06/12/21 [id number] 06/13/21 0443 06/14/21 0428  ?Weight: 99.8 kg 98.5 kg 98 kg  ? ? ? ?Intake/Output Summary (Last 24 hours) at 06/14/2021 0926 ?Last data filed at 06/13/2021 2300 ?Gross per 24 hour  ?Intake 2015 ml  ?Output --  ?Net 2015 ml  ? ?Net IO Since Admission: 2,021 mL [06/14/21 0926] ? ?Pertinent Labs: ?CBC Latest Ref Rng & Units 06/13/2021 06/12/2021  ?WBC 4.0 - 10.5 K/uL 9.1 9.9  ?Hemoglobin 12.0 - 15.0 g/dL 12.1 12.8  ?Hematocrit 36.0 - 46.0 % 37.0 41.3  ?Platelets 150 - 400 K/uL 289 303  ? ? ?CMP Latest Ref Rng & Units 06/14/2021 06/13/2021 06/12/2021  ?Glucose 70 - 99 mg/dL 98 108(H) 107(H)  ?BUN 6 - 20 mg/dL 14 14 16   ?Creatinine 0.44 - 1.00 mg/dL 1.10(H) 1.17(H) 1.31(H)  ?Sodium 135 - 145 mmol/L 135 136 139  ?Potassium 3.5 - 5.1 mmol/L 3.6 3.0(L) 3.6  ?Chloride 98 - 111 mmol/L 104 102 103  ?CO2 22 - 32 mmol/L 23 24 23   ?Calcium 8.9 - 10.3 mg/dL  9.3 9.2 9.6  ?Total Protein 6.5 - 8.1 g/dL - 7.0 7.7  ?Total Bilirubin 0.3 - 1.2 mg/dL - 0.4 0.2(L)  ?Alkaline Phos 38 - 126 U/L - 65 69  ?AST 15 - 41 U/L - 14(L) 19  ?ALT 0 - 44 U/L - 12 14  ? ? ?Imaging: ?No results found. ? ?Assessment/Plan:  ? ?Principal Problem: ?  Severe uncontrolled hypertension ?Active Problems: ?  Severe hypertension ? ? ?Patient Summary: ?Bethany Ball is a 31 y.o.  with a pertinent PMH of HTN, who presents to [facility] with uncontrolled hypertension and admit for severe, symptomatic HTN.  ?  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 Still intermittently requiring labetalol. Given patient has a possible AKI from an unclear baseline holding off on ACE/ARB at this time, creatinine has slowly continued to improve. Blood pressures have still been elevated to 180s. Hydralazine 10 was just given this morning at 8 am, If pressures remain elevated despite this additional medication today will increase dose to 25 with lunch.  ?- morning 17 hydroxyprogesterone level pending, if >200 ng/dL (6 nmol/L) will proceed with ACTH stim test ?- HCTZ 25 mg daily, Amlodipine 10 mg  daily, spiro 25 ?- start hydral 10 TID, titrate up as needed ?-Labetalol 10 to 20 mg as needed for SBP greater than 180 and/or DBP greater than 110, hold if heart rate less than 70 ?-f/u Plasma aldosterone and renin pending ?  ?AKI: improving ?Presents with presumably an elevated creatinine of 1.31 and a GFR 56 in the setting of severe symptomatic hypertension. Creatinine has improved to 1.1. Consider ACE/Arb if patient is deemed to have preexisting renal disease once possible AKI is resolved ?-Avoid nephrotoxic medications ?-daily bmp ?  ?Obesity, Hyperlipidemia ?LDL elevated to 156, will benefit from dietary counseling in the outpatient setting ?  ?Diet: Normal ?IVF: None ?VTE: Enoxaparin ?Code: Full ?  ?Prior to  Admission Living Arrangement: Home ?Anticipated Discharge Location: Home ?Barriers to Discharge: continued medical workup ?Dispo: Anticipated discharge to Home in 1 days pending BP control.  ?  ? ?Scarlett Presto, MD ?Internal Medicine Resident PGY-1 ?Pager [phone] ?Please contact the on call pager after 5 pm and on weekends at [phone]. ? ?

## 2021-06-14 NOTE — Progress Notes (Signed)
Patient's heart rate in [id number] with systolic pressures greater than 180 at times. Patient only able to tolerated 10mg  of labetalol at a time. Giving 20mg  in one dose cause patient to immediately have bad headache. ?

## 2021-06-15 LAB — BASIC METABOLIC PANEL
Anion gap: 12 (ref 5–15)
BUN: 20 mg/dL (ref 6–20)
CO2: 23 mmol/L (ref 22–32)
Calcium: 9.5 mg/dL (ref 8.9–10.3)
Chloride: 101 mmol/L (ref 98–111)
Creatinine, Ser: 1.24 mg/dL — ABNORMAL HIGH (ref 0.44–1.00)
GFR, Estimated: 59 mL/min — ABNORMAL LOW (ref >60)
Glucose, Bld: 97 mg/dL (ref 70–99)
Potassium: 3.7 mmol/L (ref 3.5–5.1)
Sodium: 136 mmol/L (ref 135–145)

## 2021-06-15 MED ORDER — HYDRALAZINE HCL 25 MG PO TABS: 25.0000 mg | ORAL_TABLET | Freq: Three times a day (TID) | ORAL | 0 refills | Status: DC

## 2021-06-15 MED ORDER — HYDROCHLOROTHIAZIDE 25 MG PO TABS: 25.0000 mg | ORAL_TABLET | Freq: Every day | ORAL | 0 refills | Status: DC

## 2021-06-15 MED ORDER — SPIRONOLACTONE 25 MG PO TABS: 25.0000 mg | ORAL_TABLET | Freq: Every day | ORAL | 0 refills | Status: DC

## 2021-06-15 MED ORDER — AMLODIPINE BESYLATE 10 MG PO TABS: 10.0000 mg | ORAL_TABLET | Freq: Every day | ORAL | 0 refills | Status: DC

## 2021-06-15 NOTE — Discharge Summary (Signed)
Name: Bethany Ball MRN: [id number] DOB: October 07, 1990 31 y.o. PCP: Pcp, No  Date of Admission: 06/12/2021  6:34 AM Date of Discharge:   06/15/21 Attending Physician: Miguel Aschoff, MD  Discharge Diagnosis: 1. 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 AKI vs CKD 3. Obesity, Hyperlipidemia  Discharge Medications: Allergies as of 06/15/2021   No Known Allergies      Medication List     TAKE these medications    amLODipine 10 MG tablet Commonly known as: NORVASC Take 1 tablet (10 mg total) by mouth daily. Start taking on: June 16, 2021 What changed:  medication strength how much to take   [facility] HEADACHE PO Take 1 packet by mouth daily as needed (headaches/pain).   hydrALAZINE 25 MG tablet Commonly known as: APRESOLINE Take 1 tablet (25 mg total) by mouth every 8 (eight) hours.   hydrochlorothiazide 25 MG tablet Commonly known as: HYDRODIURIL Take 1 tablet (25 mg total) by mouth daily. Start taking on: June 16, 2021   spironolactone 25 MG tablet Commonly known as: ALDACTONE Take 1 tablet (25 mg total) by mouth daily. Start taking on: June 16, 2021        Disposition and follow-up:   Bethany Ball was discharged from [facility] in Stable condition.  At the hospital follow up visit please address:  1.  Blood pressure- recheck pressure and adjust medications as needed  2. AKI vs CKD- recheck bmp   2.  Labs / imaging needed at time of follow-up: bmp  3.  Pending labs/ test needing follow-up: 17-hydroxyprogesterone, renin/aldo, plasma metanephrines  Follow-up Appointments:  Follow-up Information     [facility] Follow up on 06/18/2021.   Specialty: Internal Medicine Why: Your appointment is at 9:20 am. Please arrive early and bring a picture ID and your current medications Contact information: [address] [place]  [address] [phone]        [facility] Follow up.   Why: Please use this pharmacy location for assistance with the cost of your medications Contact information: [address] [employer] [address] [place], [place] [address] [phone]                Hospital Course by problem list: 1. 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 She presented for an employment physical and was found to have BP of 250 systolic and was sent to the ED. She was asymptomatic at that time with no headache, blurry vision, dizziness. Her blood pressures were difficult to control in the hospital requiring multiple medications. Renal artery ultrasound was obtained which was normal. Other workup for secondary causes of hypertension was started including aldo/renin, plasma metanephrines, and 17-hydroxyprogesterone. She will be discharged on medications to be titrated in the outpatient setting in the [facility] clinic. She will be discharged with amlodipine 10, HCTZ 25, spironolactone 25, and hydralazine 25 TID. She will likely benefit from ace/arb as an outpatient with discontinuation of hydralazine with its inconvenient dosing but given patient had an elevated creatinine will wait to start that until the outpatient setting given concern for AKI. Upon discharge pressures were between 150-180 systolic.  2. AKI vs CKD Patient came in with a creatinine of 1.3 in the setting of severe  hypertension. It is unclear whether this is the patient's baseline or and AKI. Her creatinine ranged between 1.1-1.3 during admission with a GFR in the 50s-60s. Likely will benefit from starting Ace/arb as above.  3. Obesity/HLD LDL elevated at 156. Normal A1c. Will benefit from dietary counseling in the outpatient setting.  Discharge Exam:   BP (!) 180/112 (BP  Location: Right Arm)    Pulse 74    Temp 98.6 F (37 C) (Oral)    Resp 16    Ht 5\' 7"  (1.702 m)    Wt 97.5 kg    SpO2 100%    BMI 33.67 kg/m  Discharge exam:  Constitutional: young woman resting comfortably in bed, in no acute distress HEENT: mucous membranes moist, conjunctiva non-erythematous, increased terminal hair growth over the chin Cardiovascular: regular rate and rhythm, no m/r/g Pulmonary/Chest: normal work of breathing on room air, lungs clear to auscultation bilaterally Abdominal: soft, non-tender, non-distended MSK: normal bulk and tone Neurological: alert & oriented x 3, answering questions appropriately Skin: warm and dry Psych: normal affect  Pertinent Labs, Studies, and Procedures:   CT HEAD WO CONTRAST ( )  Result Date: 06/12/2021 CLINICAL DATA:  Headache. EXAM: CT HEAD WITHOUT CONTRAST TECHNIQUE: Contiguous axial images were obtained from the base of the skull through the vertex without intravenous contrast. RADIATION DOSE REDUCTION: This exam was performed according to the departmental dose-optimization program which includes automated exposure control, adjustment of the mA and/or kV according to patient size and/or use of iterative reconstruction technique. COMPARISON:  None. FINDINGS: Brain: No evidence of acute infarction, hemorrhage, hydrocephalus, extra-axial collection or mass lesion/mass effect. Vascular: No hyperdense vessel or unexpected calcification. Skull: Normal. Negative for fracture or focal lesion. Sinuses/Orbits: No acute finding. Other: None. IMPRESSION: No acute intracranial abnormality seen. Electronically Signed   By: 08/12/2021 M.D.   On: 06/12/2021 12:35   VAS 08/12/2021 RENAL ARTERY DUPLEX  Result Date: 06/13/2021 ABDOMINAL VISCERAL Patient Name:  Bethany Ball  Date of Exam:   06/13/2021 Medical Rec #: 08/13/2021           Accession #:    [id number] Date of Birth: 1991/02/01           Patient Gender: F Patient Age:   65 years Exam Location:  [facility] Procedure:      VAS [facility] RENAL ARTERY DUPLEX Referring Phys: [place] JOSHUA ZAVITZ -------------------------------------------------------------------------------- Indications: Uncontrolled hypertension High Risk Factors: Hypertension. Limitations: Air/bowel gas. Comparison Study: No prior. Performing Technologist: [id number] RDMS, RVT  Examination Guidelines: A complete evaluation includes B-mode imaging, spectral Doppler, color Doppler, and power Doppler as needed of all accessible portions of each vessel. Bilateral testing is considered an integral part of a complete examination. Limited examinations for reoccurring indications may be performed as noted.  Duplex Findings: +--------------------+--------+--------+------+--------+  Mesenteric           PSV cm/s EDV cm/s Plaque Comments  +--------------------+--------+--------+------+--------+  Aorta Prox             100                              +--------------------+--------+--------+------+--------+  Celiac Artery Origin   186                              +--------------------+--------+--------+------+--------+  SMA Proximal           184                              +--------------------+--------+--------+------+--------+    +------------------+--------+--------+-------+  Right Renal Artery PSV cm/s EDV cm/s Comment  +------------------+--------+--------+-------+  Origin                83       28             +------------------+--------+--------+-------+  Proximal              89       27             +------------------+--------+--------+-------+  Mid                  112       44             +------------------+--------+--------+-------+  Distal                97       27             +------------------+--------+--------+-------+ +-----------------+--------+--------+-------+  Left Renal Artery PSV cm/s EDV cm/s Comment  +-----------------+--------+--------+-------+  Origin               95       31              +-----------------+--------+--------+-------+  Proximal            126       43             +-----------------+--------+--------+-------+  Mid                 108       33             +-----------------+--------+--------+-------+  Distal               97       35             +-----------------+--------+--------+-------+ +------------+--------+--------+----+-----------+--------+--------+----+  Right Kidney PSV cm/s EDV cm/s RI   Left Kidney PSV cm/s EDV cm/s RI    +------------+--------+--------+----+-----------+--------+--------+----+  Upper Pole   22       8        0.62 Upper Pole  16       8        0.49  +------------+--------+--------+----+-----------+--------+--------+----+  Mid          27       13       0.53 Mid         34       13       0.62  +------------+--------+--------+----+-----------+--------+--------+----+  Lower Pole   35       14       0.59 Lower Pole  26       10       0.62  +------------+--------+--------+----+-----------+--------+--------+----+  Hilar        49       19       0.62 Hilar       52       19       0.64  +------------+--------+--------+----+-----------+--------+--------+----+ +------------------+-----+------------------+-----+  Right Kidney             Left Kidney               +------------------+-----+------------------+-----+  RAR                      RAR                       +------------------+-----+------------------+-----+  RAR (manual)       0.83  RAR (manual)       0.95   +------------------+-----+------------------+-----+  Cortex                   Cortex                    +------------------+-----+------------------+-----+  Cortex thickness         Corex thickness           +------------------+-----+------------------+-----+  Kidney length (cm) 10.59 Kidney length (cm) 10.85  +------------------+-----+------------------+-----+  Summary: Renal:  Right: No evidence of right renal artery stenosis. Normal right        Resisitive Index. Normal size right kidney. Left:  No  evidence of left renal artery stenosis. Normal left        Resistive Index. Normal size of left kidney.  *See table(s) above for measurements and observations.  Diagnosing physician: Gerarda Fraction  Electronically signed by Gerarda Fraction on 06/13/2021 at 7:25:35 PM.    Final     CBC Latest Ref Rng & Units 06/13/2021 06/12/2021  WBC 4.0 - 10.5 K/uL 9.1 9.9  Hemoglobin 12.0 - 15.0 g/dL 56.9 79.4  Hematocrit 80.1 - 46.0 % 37.0 41.3  Platelets 150 - 400 K/uL 289 303    BMP Latest Ref Rng & Units 06/15/2021 06/14/2021 06/13/2021  Glucose 70 - 99 mg/dL 97 98 655(V)  BUN 6 - 20 mg/dL 20 14 14   Creatinine 0.44 - 1.00 mg/dL 7.48(O) 7.07(E) 6.75(Q)  Sodium 135 - 145 mmol/L 136 135 136  Potassium 3.5 - 5.1 mmol/L 3.7 3.6 3.0(L)  Chloride 98 - 111 mmol/L 101 104 102  CO2 22 - 32 mmol/L 23 23 24   Calcium 8.9 - 10.3 mg/dL 9.5 9.3 9.2    Discharge Instructions: Discharge Instructions     Call MD for:  difficulty breathing, headache or visual disturbances   Complete by: As directed    Call MD for:  persistant dizziness or light-headedness   Complete by: As directed    Call MD for:  persistant nausea and vomiting   Complete by: As directed    Diet - low sodium heart healthy   Complete by: As directed    Increase activity slowly   Complete by: As directed        Signed: Ilene Qua, MD 06/15/2021, 12:04 PM   Pager: [phone]

## 2021-06-15 NOTE — Discharge Instructions (Signed)
Bethany Ball ? ?You were recently admitted to [facility] for high blood pressure.  ? ?Continue taking your home medications with the following changes ? ?Start taking amlodipine 10 daily, HCTZ 25 daily, Spironolactone 25 daily, and hydralazine 25 three times a day ? ? ?You should seek further medical care if you have new blurry vision, headache, dizziness, or trouble breathing. ? ?We recommend that you see [place] in clinic in about a week to make sure that you continue to improve. We are so glad that you are feeling better. ? ?Sincerely, ?Ilene Qua, MD ? ? ?

## 2021-06-15 NOTE — TOC Transition Note (Signed)
Transition of Care (TOC) - CM/SW Discharge Note ? ? ?Patient Details  ?Name: [facility] ?MRN: [id number] ?Date of Birth: 1990-12-11 ? ?Transition of Care (TOC) CM/SW Contact:  ?Lawerance Sabal, RN ?Phone Number: ?06/15/2021, 12:12 PM ? ? ?Clinical Narrative:    ?DC to home self care. ?PCP appointment on AVS. ?Meds are generic $4-9 at [facility], no MATCH needs.  ?No other TOC needs for DC.  ? ? ? ?Final next level of care: Home/Self Care ?Barriers to Discharge: No Barriers Identified ? ? ?Patient Goals and CMS Choice ?  ?  ?  ? ?Discharge Placement ?  ?           ?  ?  ?  ?  ? ?Discharge Plan and Services ?  ?Discharge Planning Services: CM Consult ?           ?  ?  ?  ?  ?  ?  ?  ?  ?  ?  ? ?Social Determinants of Health (SDOH) Interventions ?  ? ? ?Readmission Risk Interventions ?No flowsheet data found. ? ? ? ? ?

## 2021-06-15 NOTE — Progress Notes (Signed)
Patient ready for discharge to home; discharge instructions given and reviewed; Rx sent electronicaly. Patient dressed and ready for discharge. Patient elected to ambulate out of the hospital; patient discharged. ?

## 2021-06-15 NOTE — Progress Notes (Signed)
? ? ?HD#2 ?Subjective:  ?Overnight Events: NAEO ? ? Patient feeling well this morning. Understands the plan for the day. Would like to leave today if possible. ? ?Objective:  ?Vital signs in last 24 hours: ?Vitals:  ? 06/15/21 0102 06/15/21 0407 06/15/21 0500 06/15/21 6767  ?BP: (!) 165/104 (!) 160/112 (!) 157/99 (!) 170/109  ?Pulse: 67 70 74 65  ?Resp:  16  16  ?Temp:  98.2 ?F (36.8 ?C)  98.6 ?F (37 ?C)  ?TempSrc:  Oral  Oral  ?SpO2:  97%  96%  ?Weight:   97.5 kg   ?Height:      ? ?Supplemental O2: Room Air ?SpO2: 96 % ? ? ?Physical Exam:  ?Constitutional: young woman resting comfortably in bed, in no acute distress ?HEENT: mucous membranes moist, conjunctiva non-erythematous, increased terminal hair growth over the chin ?Cardiovascular: regular rate and rhythm ?Pulmonary/Chest: normal work of breathing on room air ?Abdominal: soft, non-tender, non-distended ?MSK: normal bulk and tone ?Neurological: alert & oriented x 3, answering questions appropriately ?Skin: warm and dry ?Psych: normal affect ? ?Filed Weights  ? 06/13/21 0443 06/14/21 0428 06/15/21 0500  ?Weight: 98.5 kg 98 kg 97.5 kg  ? ? ? ?Intake/Output Summary (Last 24 hours) at 06/15/2021 1130 ?Last data filed at 06/15/2021 0409 ?Gross per 24 hour  ?Intake --  ?Output 0 ml  ?Net 0 ml  ? ?Net IO Since Admission: 2,021 mL [06/15/21 1130] ? ?Pertinent Labs: ?CBC Latest Ref Rng & Units 06/13/2021 06/12/2021  ?WBC 4.0 - 10.5 K/uL 9.1 9.9  ?Hemoglobin 12.0 - 15.0 g/dL 20.9 47.0  ?Hematocrit 36.0 - 46.0 % 37.0 41.3  ?Platelets 150 - 400 K/uL 289 303  ? ? ?CMP Latest Ref Rng & Units 06/15/2021 06/14/2021 06/13/2021  ?Glucose 70 - 99 mg/dL 97 98 962(E)  ?BUN 6 - 20 mg/dL 20 14 14   ?Creatinine 0.44 - 1.00 mg/dL ) 3.66(Q) 9.47(M)  ?Sodium 135 - 145 mmol/L 136 135 136  ?Potassium 3.5 - 5.1 mmol/L 3.7 3.6 3.0(L)  ?Chloride 98 - 111 mmol/L 101 104 102  ?CO2 22 - 32 mmol/L 23 23 24   ?Calcium 8.9 - 10.3 mg/dL 9.5 9.3 9.2  ?Total Protein 6.5 - 8.1 g/dL - - 7.0  ?Total Bilirubin  0.3 - 1.2 mg/dL - - 0.4  ?Alkaline Phos 38 - 126 U/L - - 65  ?AST 15 - 41 U/L - - 14(L)  ?ALT 0 - 44 U/L - - 12  ? ? ?Imaging: ?No results found. ? ?Assessment/Plan:  ? ?Principal Problem: ?  Severe uncontrolled hypertension ?Active Problems: ?  Severe hypertension ? ? ?Patient Summary: ?Bethany Ball is a 31 y.o. with a pertinent PMH of HTN, who presents to [facility] with uncontrolled hypertension and admit for severe, symptomatic HTN.  ?  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 She will hopefully be ready for discharge today. Removed PRN labetalol and will see what her pressures look like in response to her morning meds. ?- morning 17 hydroxyprogesterone level pending, if >200 ng/dL (6 nmol/L) will proceed with ACTH stim test ?- HCTZ 25 mg daily, Amlodipine 10 mg daily, spiro 25 daily, hydral 25 TID ?- consider switching hydral to ace/arb in the outpatient setting once kidney function stabilizes ?  ?AKI vs. CKD: stable ?Creatinine has increased slightly today, it is possible that patient's baseline may be around 1.3. Will continue to monitor with  repeat BMP in the outpatient setting ?-Avoid nephrotoxic medications ?-daily bmp ?  ?Obesity, Hyperlipidemia ?LDL elevated to 156, will benefit from dietary counseling in the outpatient setting ?  ?Diet: Normal ?IVF: None ?VTE: Enoxaparin ?Code: Full ? ?Ilene Qua, MD ?Internal Medicine Resident PGY-1 ?Pager [phone] ?Please contact the on call pager after 5 pm and on weekends at [phone]. ? ?

## 2021-06-18 ENCOUNTER — Ambulatory Visit: Payer: Self-pay | Admitting: Nurse Practitioner

## 2021-06-18 ENCOUNTER — Ambulatory Visit: Payer: Self-pay | Admitting: Internal Medicine

## 2021-06-18 LAB — METANEPHRINES, PLASMA
Metanephrine, Free: <10 pg/mL (ref 0.0–88.0)
Normetanephrine, Free: 156.1 pg/mL (ref 0.0–210.1)

## 2021-06-23 ENCOUNTER — Other Ambulatory Visit: Payer: Self-pay

## 2021-06-23 ENCOUNTER — Ambulatory Visit (INDEPENDENT_AMBULATORY_CARE_PROVIDER_SITE_OTHER): Payer: Self-pay | Admitting: Nurse Practitioner

## 2021-06-23 ENCOUNTER — Encounter: Payer: Self-pay | Admitting: Nurse Practitioner

## 2021-06-23 VITALS — BP 155/98 | HR 89 | Temp 98.0°F | Ht 67.0 in | Wt 216.6 lb

## 2021-06-23 DIAGNOSIS — I10 Essential (primary) hypertension: Secondary | ICD-10-CM

## 2021-06-23 DIAGNOSIS — E876 Hypokalemia: Secondary | ICD-10-CM

## 2021-06-23 DIAGNOSIS — H6123 Impacted cerumen, bilateral: Secondary | ICD-10-CM

## 2021-06-23 DIAGNOSIS — Z0000 Encounter for general adult medical examination without abnormal findings: Secondary | ICD-10-CM

## 2021-06-23 LAB — 17-HYDROXYPROGESTERONE: 17-OH-Progesterone, LC/MS/MS: 60 ng/dL

## 2021-06-23 MED ORDER — POTASSIUM CHLORIDE CRYS ER 20 MEQ PO TBCR: 20.0000 meq | EXTENDED_RELEASE_TABLET | Freq: Every day | ORAL | 3 refills | Status: DC

## 2021-06-23 MED ORDER — CLONIDINE HCL 0.1 MG PO TABS: 0.2000 mg | ORAL_TABLET | Freq: Once | ORAL | Status: DC

## 2021-06-23 MED ORDER — LOSARTAN POTASSIUM-HCTZ 100-25 MG PO TABS: 1.0000 | ORAL_TABLET | Freq: Every day | ORAL | 0 refills | Status: DC

## 2021-06-23 NOTE — Progress Notes (Signed)
[facility] Patient [facility] [address] [place], [place]  [address] Phone:  [phone]   Fax:  [phone] Subjective:   Patient ID: Bethany Ball, female    DOB: [birth year], 31 y.o.   MRN: [id number]  Chief Complaint  Patient presents with   Establish Care    Patient is here today to establish care and to discuss her hospital visit. Patient also states that when she went to an urgent care to do her physical for a job it couldn't be completed due to her elevated blood pressure and that is how she ended up in the hospital.   HPI [facility] 30 y.o. female  has a past medical history of Hypertension. To the [facility] for hospital follow up and to establish care.   States that she was sent to the ED on 06/12/21 after having elevated B/P in urgent. Patient was admitted to the hospital until June 15, 2021. Since being discharged patient has been compliant with all medications. Has been taking B/P at home, 200/117, last reading. Endorses having some lightheadedness, but denies any other symptoms. Hypertension in family history. Has changed diet since being discharged from the hospital, reducing salt intake. Currently walks 2 miles 4 days/ week.   Currently resides with family, but endorses having recent increase in stressors related to being unemployed. Denies any other symptoms or concerns today.   Denies any fever. Denies any fatigue, chest pain, shortness of breath or HA.  Denies any blurred vision, numbness or tingling.  Past Medical History:  Diagnosis Date   Hypertension    does not take her meds that are prescribed    History reviewed. No pertinent surgical history.  Family History  Problem Relation Age of Onset   Hypertension Mother    Hypertension Father    Stroke Father     Social History   Socioeconomic History   Marital status: Single    Spouse name: Not on file   Number of children: Not on file   Years of education: Not on file   Highest education  level: Not on file  Occupational History   Not on file  Tobacco Use   Smoking status: Never   Smokeless tobacco: Never  Vaping Use   Vaping Use: Never used  Substance and Sexual Activity   Alcohol use: Never   Drug use: Never   Sexual activity: Not on file  Other Topics Concern   Not on file  Social History Narrative   Not on file   Social Determinants of Health   Financial Resource Strain: Not on file  Food Insecurity: Not on file  Transportation Needs: Not on file  Physical Activity: Not on file  Stress: Not on file  Social Connections: Not on file  Intimate Partner Violence: Not on file    Outpatient Medications Prior to Visit  Medication Sig Dispense Refill   amLODipine (NORVASC) 10 MG tablet Take 1 tablet (10 mg total) by mouth daily. 30 tablet 0   Aspirin-Salicylamide-Caffeine (BC HEADACHE PO) Take 1 packet by mouth daily as needed (headaches/pain).     spironolactone (ALDACTONE) 25 MG tablet Take 1 tablet (25 mg total) by mouth daily. 30 tablet 0   hydrALAZINE (APRESOLINE) 25 MG tablet Take 1 tablet (25 mg total) by mouth every 8 (eight) hours. 90 tablet 0   hydrochlorothiazide (HYDRODIURIL) 25 MG tablet Take 1 tablet (25 mg total) by mouth daily. 30 tablet 0   No facility-administered medications prior to visit.  No Known Allergies  Review of Systems  Constitutional:  Negative for chills, fever and malaise/fatigue.  HENT: Negative.    Eyes: Negative.   Respiratory:  Negative for cough and shortness of breath.   Cardiovascular:  Negative for chest pain, palpitations and leg swelling.  Gastrointestinal:  Negative for abdominal pain, blood in stool, constipation, diarrhea, nausea and vomiting.  Genitourinary: Negative.   Musculoskeletal: Negative.   Skin: Negative.   Neurological:  Positive for dizziness.  Psychiatric/Behavioral: Negative.  Negative for depression. The patient is not nervous/anxious.   All other systems reviewed and are negative.      Objective:    Physical Exam Constitutional:      General: She is not in acute distress.    Appearance: Normal appearance.  HENT:     Head: Normocephalic.     Right Ear: There is impacted cerumen.     Left Ear: There is impacted cerumen.     Ears:     Comments: Exam wnl after removal impaction    Nose: Nose normal. No congestion or rhinorrhea.     Mouth/Throat:     Mouth: Mucous membranes are moist.     Pharynx: Oropharynx is clear. No oropharyngeal exudate or posterior oropharyngeal erythema.  Eyes:     General: No scleral icterus.       Right eye: No discharge.        Left eye: No discharge.     Extraocular Movements: Extraocular movements intact.     Conjunctiva/sclera: Conjunctivae normal.     Pupils: Pupils are equal, round, and reactive to light.  Neck:     Vascular: No carotid bruit.  Cardiovascular:     Rate and Rhythm: Normal rate and regular rhythm.     Pulses: Normal pulses.     Heart sounds: Normal heart sounds.     Comments: No obvious peripheral edema Pulmonary:     Effort: Pulmonary effort is normal.     Breath sounds: Normal breath sounds.  Abdominal:     General: Abdomen is flat. Bowel sounds are normal. There is no distension.     Palpations: Abdomen is soft. There is no mass.     Tenderness: There is no abdominal tenderness. There is no right CVA tenderness, left CVA tenderness, guarding or rebound.     Hernia: No hernia is present.  Musculoskeletal:        General: No swelling, tenderness, deformity or signs of injury. Normal range of motion.     Cervical back: Normal range of motion and neck supple. No rigidity or tenderness.     Right lower leg: No edema.     Left lower leg: No edema.  Lymphadenopathy:     Cervical: No cervical adenopathy.  Skin:    General: Skin is warm and dry.     Capillary Refill: Capillary refill takes less than 2 seconds.  Neurological:     General: No focal deficit present.     Mental Status: She is alert and oriented to  person, place, and time.  Psychiatric:        Mood and Affect: Mood normal.        Behavior: Behavior normal.        Thought Content: Thought content normal.        Judgment: Judgment normal.    BP (!) 155/98 (BP Location: Left Arm, Cuff Size: Large)    Pulse 89    Temp 98 F (36.7 C)    Ht 5\' 7"  (1.702  m)    Wt 216 lb 9.6 oz (98.2 kg)    LMP 06/03/2021 (Exact Date)    SpO2 98%    BMI 33.92 kg/m  Wt Readings from Last 3 Encounters:  06/23/21 216 lb 9.6 oz (98.2 kg)  06/15/21 214 lb 15.2 oz (97.5 kg)  04/29/18 240 lb (108.9 kg)     There is no immunization history on file for this patient.  Diabetic Foot Exam - Simple   No data filed     Lab Results  Component Value Date   TSH 1.158 06/12/2021   Lab Results  Component Value Date   WBC 9.1 06/13/2021   HGB 12.1 06/13/2021   HCT 37.0 06/13/2021   MCV 86.0 06/13/2021   PLT 289 06/13/2021   Lab Results  Component Value Date   NA 136 06/15/2021   K 3.7 06/15/2021   CO2 23 06/15/2021   GLUCOSE 97 06/15/2021   BUN 20 06/15/2021   CREATININE 1.24 (H) 06/15/2021   BILITOT 0.4 06/13/2021   ALKPHOS 65 06/13/2021   AST 14 (L) 06/13/2021   ALT 12 06/13/2021   PROT 7.0 06/13/2021   ALBUMIN 3.7 06/13/2021   CALCIUM 9.5 06/15/2021   ANIONGAP 12 06/15/2021   Lab Results  Component Value Date   CHOL 229 (H) 06/12/2021   Lab Results  Component Value Date   HDL 43 06/12/2021   Lab Results  Component Value Date   LDLCALC 156 (H) 06/12/2021   Lab Results  Component Value Date   TRIG 152 (H) 06/12/2021   Lab Results  Component Value Date   CHOLHDL 5.3 06/12/2021   Lab Results  Component Value Date   HGBA1C 5.0 06/12/2021       Assessment & Plan:   Problem List Items Addressed This Visit       Cardiovascular and Mediastinum   Bilateral Cerumen Impaction  Bilateral ear irrigation completed, with exam wnl after successful removal of impaction Educated on methods for preventing future impactions Severe  uncontrolled hypertension - Primary   Relevant Medications   cloNIDine (CATAPRES) tablet 0.2 mg   losartan-hydrochlorothiazide (HYZAAR) 100-25 MG tablet, added to therapy, hydralazine discontinued    Other Relevant Orders   CBC with Differential/Platelet   Comprehensive metabolic panel Encouraged continued diet and exercise efforts  Encouraged continued compliance with medication  Encouraged to continued checking B/P at home regularly  Given anticipatory guidance    Other Visit Diagnoses     Encounter for wellness examination in adult       Relevant Orders   CBC with Differential/Platelet   Comprehensive metabolic panel Encouraged continued diet and exercise efforts  Encouraged continued compliance with medication     Hypokalemia       Relevant Medications   potassium chloride SA (KLOR-CON M) 20 MEQ tablet, added to  the therapy due to low normal potassium and addition of HCTZ for management of HTN Given anticipatory guidance Will monitor potassium regularly    Follow up in 2 wks for reevaluation of HTN, sooner as needed     I have discontinued Francene Burright's hydrALAZINE and hydrochlorothiazide. I am also having her start on losartan-hydrochlorothiazide and potassium chloride SA. Additionally, I am having her maintain her Aspirin-Salicylamide-Caffeine (BC HEADACHE PO), amLODipine, and spironolactone. We will continue to administer cloNIDine.  Meds ordered this encounter  Medications   cloNIDine (CATAPRES) tablet 0.2 mg   losartan-hydrochlorothiazide (HYZAAR) 100-25 MG tablet    Sig: Take 1 tablet by mouth daily.  Dispense:  30 tablet    Refill:  0   potassium chloride SA (KLOR-CON M) 20 MEQ tablet    Sig: Take 1 tablet (20 mEq total) by mouth daily.    Dispense:  30 tablet    Refill:  3     Kathrynn Speedewana I Arul Farabee, NP

## 2021-06-23 NOTE — Patient Instructions (Signed)
You were seen today in the [facility] to establish care and for evaluation of hypertension. Labs were collected, results will be available via MyChart or, if abnormal, you will be contacted by clinic staff. You were prescribed medications, please take as directed. Please follow up in 2 wks for reevaluation of hypertension. ?

## 2021-06-24 LAB — CBC WITH DIFFERENTIAL/PLATELET
Basophils Absolute: 0.1 10*3/uL (ref 0.0–0.2)
Basos: 1 %
EOS (ABSOLUTE): 0.1 10*3/uL (ref 0.0–0.4)
Eos: 1 %
Hematocrit: 39.7 % (ref 34.0–46.6)
Hemoglobin: 12.7 g/dL (ref 11.1–15.9)
Immature Grans (Abs): 0 10*3/uL (ref 0.0–0.1)
Immature Granulocytes: 0 %
Lymphocytes Absolute: 2.3 10*3/uL (ref 0.7–3.1)
Lymphs: 17 %
MCH: 27.6 pg (ref 26.6–33.0)
MCHC: 32 g/dL (ref 31.5–35.7)
MCV: 86 fL (ref 79–97)
Monocytes Absolute: 0.7 10*3/uL (ref 0.1–0.9)
Monocytes: 5 %
Neutrophils Absolute: 10.5 10*3/uL — ABNORMAL HIGH (ref 1.4–7.0)
Neutrophils: 76 %
Platelets: 348 10*3/uL (ref 150–450)
RBC: 4.6 x10E6/uL (ref 3.77–5.28)
RDW: 13.6 % (ref 11.7–15.4)
WBC: 13.7 10*3/uL — ABNORMAL HIGH (ref 3.4–10.8)

## 2021-06-24 LAB — COMPREHENSIVE METABOLIC PANEL
ALT: 15 IU/L (ref 0–32)
AST: 20 IU/L (ref 0–40)
Albumin/Globulin Ratio: 1.7 (ref 1.2–2.2)
Albumin: 5 g/dL — ABNORMAL HIGH (ref 3.8–4.8)
Alkaline Phosphatase: 80 IU/L (ref 44–121)
BUN/Creatinine Ratio: 12 (ref 9–23)
BUN: 14 mg/dL (ref 6–20)
Bilirubin Total: <0.2 mg/dL (ref 0.0–1.2)
CO2: 23 mmol/L (ref 20–29)
Calcium: 10.1 mg/dL (ref 8.7–10.2)
Chloride: 94 mmol/L — ABNORMAL LOW (ref 96–106)
Creatinine, Ser: 1.16 mg/dL — ABNORMAL HIGH (ref 0.57–1.00)
Globulin, Total: 3 g/dL (ref 1.5–4.5)
Glucose: 100 mg/dL — ABNORMAL HIGH (ref 70–99)
Potassium: 3.8 mmol/L (ref 3.5–5.2)
Sodium: 133 mmol/L — ABNORMAL LOW (ref 134–144)
Total Protein: 8 g/dL (ref 6.0–8.5)
eGFR: 64 mL/min/{1.73_m2} (ref >59)

## 2021-07-07 ENCOUNTER — Other Ambulatory Visit: Payer: Self-pay

## 2021-07-07 ENCOUNTER — Ambulatory Visit (INDEPENDENT_AMBULATORY_CARE_PROVIDER_SITE_OTHER): Payer: Self-pay | Admitting: Nurse Practitioner

## 2021-07-07 ENCOUNTER — Encounter: Payer: Self-pay | Admitting: Nurse Practitioner

## 2021-07-07 VITALS — BP 154/106 | HR 89 | Temp 98.1°F | Ht 67.0 in | Wt 218.5 lb

## 2021-07-07 DIAGNOSIS — I10 Essential (primary) hypertension: Secondary | ICD-10-CM

## 2021-07-07 MED ORDER — CLONIDINE HCL 0.2 MG PO TABS: 0.2000 mg | ORAL_TABLET | Freq: Two times a day (BID) | ORAL | 0 refills | Status: DC

## 2021-07-07 NOTE — Patient Instructions (Signed)
You were seen today in the [facility] for reevaluation of hypertension. Labs were collected, results will be available via MyChart or, if abnormal, you will be contacted by clinic staff. You were prescribed medications, please take as directed. Please follow up in 1 wk for  nurse visit B/P recheck ?

## 2021-07-07 NOTE — Progress Notes (Signed)
? ?[place] ?[place], [place]  [address] ?Phone:  [phone]   Fax:  [phone] ?Subjective:  ? Patient ID: Bethany Ball, female    DOB: [birth year], 31 y.o.   MRN: [id number] ? ?Chief Complaint  ?Patient presents with  ? Follow-up  ?  Pt is here for 2 weeks follow up for BP. Pt stated she is concern with having nose bleeds 2 day's ago pt stated she felt dizziness   ? ?HPI ?[facility] 31 y.o. female  has a past medical history of Hypertension. To the [facility] for reevaluation of hypertension.  ? ?Hypertension: Patient here for follow-up of elevated blood pressure. She is exercising and is adherent to low salt diet.  Blood pressure is not well controlled at home. Cardiac symptoms chest pain, dyspnea, fatigue, and lower extremity edema. Patient denies chest pain, dyspnea, fatigue, and near-syncope.  Cardiovascular risk factors: hypertension and obesity (BMI >= 30 kg/m2). Use of agents associated with hypertension: none. History of target organ damage: none. States that B/P at home is typically 160/unknown. Endorses having three episodes nosebleeds and dizziness in the past week, when B/p becomes elevated.  ? ?Denies any other concerns today. Requesting completion of release paperwork for work, is supposed to start new job next week. Endorses being compliant with all medications. Denies any fatigue, chest pain or shortness of breath. Denies any blurred vision, numbness or tingling. ? ?Past Medical History:  ?Diagnosis Date  ? Hypertension   ? does not take her meds that are prescribed  ? ? ?History reviewed. No pertinent surgical history. ? ?Family History  ?Problem Relation Age of Onset  ? Hypertension Mother   ? Hypertension Father   ? Stroke Father   ? ? ?Social History  ? ?Socioeconomic History  ? Marital status: Single  ?  Spouse name: Not on file  ? Number of children: Not on file  ? Years of education: Not on file  ? Highest education level: Not on file   ?Occupational History  ? Not on file  ?Tobacco Use  ? Smoking status: Never  ? Smokeless tobacco: Never  ?Vaping Use  ? Vaping Use: Never used  ?Substance and Sexual Activity  ? Alcohol use: Never  ? Drug use: Never  ? Sexual activity: Not on file  ?Other Topics Concern  ? Not on file  ?Social History Narrative  ? Not on file  ? ?Social Determinants of Health  ? ?Financial Resource Strain: Not on file  ?Food Insecurity: Not on file  ?Transportation Needs: Not on file  ?Physical Activity: Not on file  ?Stress: Not on file  ?Social Connections: Not on file  ?Intimate Partner Violence: Not on file  ? ? ?Outpatient Medications Prior to Visit  ?Medication Sig Dispense Refill  ? amLODipine (NORVASC) 10 MG tablet Take 1 tablet (10 mg total) by mouth daily. 30 tablet 0  ? Aspirin-Salicylamide-Caffeine (BC HEADACHE PO) Take 1 packet by mouth daily as needed (headaches/pain).    ? losartan-hydrochlorothiazide (HYZAAR) 100-25 MG tablet Take 1 tablet by mouth daily. 30 tablet 0  ? potassium chloride SA (KLOR-CON M) 20 MEQ tablet Take 1 tablet (20 mEq total) by mouth daily. 30 tablet 3  ? spironolactone (ALDACTONE) 25 MG tablet Take 1 tablet (25 mg total) by mouth daily. 30 tablet 0  ? cloNIDine (CATAPRES) tablet 0.2 mg     ? ?No facility-administered medications prior to visit.  ? ? ?No Known Allergies ? ?Review of Systems  ?  Constitutional:  Negative for chills, fever and malaise/fatigue.  ?HENT:  Positive for nosebleeds. Negative for congestion, ear discharge, ear pain, hearing loss, sinus pain, sore throat and tinnitus.   ?Eyes: Negative.   ?Respiratory:  Negative for cough, shortness of breath and stridor.   ?Cardiovascular:  Negative for chest pain, palpitations and leg swelling.  ?Gastrointestinal:  Negative for abdominal pain, blood in stool, constipation, diarrhea, nausea and vomiting.  ?Musculoskeletal: Negative.   ?Skin: Negative.   ?Neurological:  Positive for dizziness. Negative for tingling, tremors, sensory  change, speech change, focal weakness, seizures, loss of consciousness, weakness and headaches.  ?Psychiatric/Behavioral:  Negative for depression. The patient is not nervous/anxious.   ?All other systems reviewed and are negative. ? ?   ?Objective:  ?  ?Physical Exam ?Vitals reviewed.  ?Constitutional:   ?   General: She is not in acute distress. ?   Appearance: Normal appearance. She is obese.  ?HENT:  ?   Head: Normocephalic.  ?   Right Ear: Tympanic membrane, ear canal and external ear normal. There is no impacted cerumen.  ?   Left Ear: Tympanic membrane, ear canal and external ear normal. There is no impacted cerumen.  ?   Nose: Nose normal. No congestion or rhinorrhea.  ?Cardiovascular:  ?   Rate and Rhythm: Normal rate and regular rhythm.  ?   Pulses: Normal pulses.  ?   Heart sounds: Normal heart sounds.  ?   Comments: No obvious peripheral edema ?Pulmonary:  ?   Effort: Pulmonary effort is normal.  ?   Breath sounds: Normal breath sounds.  ?Musculoskeletal:     ?   General: No swelling, tenderness, deformity or signs of injury. Normal range of motion.  ?   Right lower leg: No edema.  ?   Left lower leg: No edema.  ?Skin: ?   General: Skin is warm and dry.  ?   Capillary Refill: Capillary refill takes less than 2 seconds.  ?Neurological:  ?   General: No focal deficit present.  ?   Mental Status: She is alert and oriented to person, place, and time.  ?Psychiatric:     ?   Mood and Affect: Mood normal.     ?   Behavior: Behavior normal.     ?   Thought Content: Thought content normal.     ?   Judgment: Judgment normal.  ? ? ?BP (!) 154/106 (BP Location: Left Arm)   Pulse 89   Temp 98.1 ?F (36.7 ?C)   Ht 5' 7" (1.702 m)   Wt 218 lb 8 oz (99.1 kg)   SpO2 100%   BMI 34.22 kg/m?  ?Wt Readings from Last 3 Encounters:  ?07/07/21 218 lb 8 oz (99.1 kg)  ?06/23/21 216 lb 9.6 oz (98.2 kg)  ?06/15/21 214 lb 15.2 oz (97.5 kg)  ? ? ? ?There is no immunization history on file for this patient. ? ?Diabetic Foot Exam  - Simple   ?No data filed ?  ? ? ?Lab Results  ?Component Value Date  ? TSH 1.158 06/12/2021  ? ?Lab Results  ?Component Value Date  ? WBC 13.7 (H) 06/23/2021  ? HGB 12.7 06/23/2021  ? HCT 39.7 06/23/2021  ? MCV 86 06/23/2021  ? PLT 348 06/23/2021  ? ?Lab Results  ?Component Value Date  ? NA 133 (L) 06/23/2021  ? K 3.8 06/23/2021  ? CO2 23 06/23/2021  ? GLUCOSE 100 (H) 06/23/2021  ? BUN 14 06/23/2021  ?  CREATININE 1.16 (H) 06/23/2021  ? BILITOT <0.2 06/23/2021  ? ALKPHOS 80 06/23/2021  ? AST 20 06/23/2021  ? ALT 15 06/23/2021  ? PROT 8.0 06/23/2021  ? ALBUMIN 5.0 (H) 06/23/2021  ? CALCIUM 10.1 06/23/2021  ? ANIONGAP 12 06/15/2021  ? EGFR 64 06/23/2021  ? ?Lab Results  ?Component Value Date  ? CHOL 229 (H) 06/12/2021  ? ?Lab Results  ?Component Value Date  ? HDL 43 06/12/2021  ? ?Lab Results  ?Component Value Date  ? LDLCALC 156 (H) 06/12/2021  ? ?Lab Results  ?Component Value Date  ? TRIG 152 (H) 06/12/2021  ? ?Lab Results  ?Component Value Date  ? CHOLHDL 5.3 06/12/2021  ? ?Lab Results  ?Component Value Date  ? HGBA1C 5.0 06/12/2021  ? ? ?   ?Assessment & Plan:  ? ?Problem List Items Addressed This Visit   ? ?  ? Cardiovascular and Mediastinum  ? Severe hypertension - Primary  ? Relevant Medications  ? cloNIDine (CATAPRES) 0.2 MG tablet, initiated during visit  ?Encouraged continued diet and exercise efforts  ?Encouraged continued compliance with medication  ?Encouraged to continue checking B/P at home  ? ? ?Please follow up in 1 wk for nurse visit, evaluation of B/P, sooner as needed   ? ? ?I am having [facility] start on cloNIDine. I am also having her maintain her Aspirin-Salicylamide-Caffeine (BC HEADACHE PO), amLODipine, spironolactone, losartan-hydrochlorothiazide, and potassium chloride SA. We will stop administering cloNIDine. ? ?Meds ordered this encounter  ?Medications  ? cloNIDine (CATAPRES) 0.2 MG tablet  ?  Sig: Take 1 tablet (0.2 mg total) by mouth 2 (two) times daily.  ?  Dispense:  60 tablet   ?  Refill:  0  ? ? ? ?Teena Dunk, NP ?  ?

## 2021-07-14 ENCOUNTER — Ambulatory Visit: Payer: Self-pay

## 2021-07-17 ENCOUNTER — Ambulatory Visit: Payer: Self-pay | Admitting: Nurse Practitioner

## 2021-07-22 NOTE — Progress Notes (Signed)
No additional note needed 

## 2021-12-31 ENCOUNTER — Encounter (HOSPITAL_COMMUNITY): Payer: Self-pay

## 2021-12-31 ENCOUNTER — Emergency Department (HOSPITAL_COMMUNITY)
Admission: EM | Admit: 2021-12-31 | Discharge: 2021-12-31 | Disposition: A | Discharge status: Home / Self Care | Payer: 59 | Attending: Emergency Medicine | Admitting: Emergency Medicine

## 2021-12-31 DIAGNOSIS — Z79899 Other long term (current) drug therapy: Secondary | ICD-10-CM | POA: Insufficient documentation

## 2021-12-31 DIAGNOSIS — I10 Essential (primary) hypertension: Secondary | ICD-10-CM | POA: Diagnosis present

## 2021-12-31 LAB — BASIC METABOLIC PANEL
Anion gap: 7 (ref 5–15)
BUN: 17 mg/dL (ref 6–20)
CO2: 21 mmol/L — ABNORMAL LOW (ref 22–32)
Calcium: 9.5 mg/dL (ref 8.9–10.3)
Chloride: 110 mmol/L (ref 98–111)
Creatinine, Ser: 1.01 mg/dL — ABNORMAL HIGH (ref 0.44–1.00)
GFR, Estimated: >60 mL/min (ref >60)
Glucose, Bld: 103 mg/dL — ABNORMAL HIGH (ref 70–99)
Potassium: 4.1 mmol/L (ref 3.5–5.1)
Sodium: 138 mmol/L (ref 135–145)

## 2021-12-31 LAB — CBC WITH DIFFERENTIAL/PLATELET
Abs Immature Granulocytes: 0.05 10*3/uL (ref 0.00–0.07)
Basophils Absolute: 0.1 10*3/uL (ref 0.0–0.1)
Basophils Relative: 1 %
Eosinophils Absolute: 0.1 10*3/uL (ref 0.0–0.5)
Eosinophils Relative: 1 %
HCT: 40.5 % (ref 36.0–46.0)
Hemoglobin: 13.3 g/dL (ref 12.0–15.0)
Immature Granulocytes: 0 %
Lymphocytes Relative: 20 %
Lymphs Abs: 2.6 10*3/uL (ref 0.7–4.0)
MCH: 28.7 pg (ref 26.0–34.0)
MCHC: 32.8 g/dL (ref 30.0–36.0)
MCV: 87.5 fL (ref 80.0–100.0)
Monocytes Absolute: 0.8 10*3/uL (ref 0.1–1.0)
Monocytes Relative: 6 %
Neutro Abs: 9.2 10*3/uL — ABNORMAL HIGH (ref 1.7–7.7)
Neutrophils Relative %: 72 %
Platelets: 302 10*3/uL (ref 150–400)
RBC: 4.63 MIL/uL (ref 3.87–5.11)
RDW: 14.8 % (ref 11.5–15.5)
WBC: 12.8 10*3/uL — ABNORMAL HIGH (ref 4.0–10.5)
nRBC: 0 % (ref 0.0–0.2)

## 2021-12-31 LAB — I-STAT BETA HCG BLOOD, ED (MC, WL, AP ONLY): I-stat hCG, quantitative: <5 m[IU]/mL (ref <5)

## 2021-12-31 MED ORDER — AMLODIPINE BESYLATE 10 MG PO TABS: 10.0000 mg | ORAL_TABLET | Freq: Every day | ORAL | 0 refills | Status: DC

## 2021-12-31 MED ORDER — SPIRONOLACTONE 25 MG PO TABS: 25.0000 mg | ORAL_TABLET | Freq: Every day | ORAL | 0 refills | Status: DC

## 2021-12-31 MED ORDER — CLONIDINE HCL 0.1 MG PO TABS
0.2000 mg | ORAL_TABLET | Freq: Two times a day (BID) | ORAL | Status: DC
  Administered 2021-12-31: 0.2 mg via ORAL
  Filled 2021-12-31: qty 2

## 2021-12-31 MED ORDER — SPIRONOLACTONE 25 MG PO TABS
25.0000 mg | ORAL_TABLET | Freq: Every day | ORAL | Status: DC
  Administered 2021-12-31: 25 mg via ORAL
  Filled 2021-12-31 (×2): qty 1

## 2021-12-31 MED ORDER — LOSARTAN POTASSIUM-HCTZ 100-25 MG PO TABS: 1.0000 | ORAL_TABLET | Freq: Every day | ORAL | Status: DC

## 2021-12-31 MED ORDER — LOSARTAN POTASSIUM 25 MG PO TABS
100.0000 mg | ORAL_TABLET | Freq: Every day | ORAL | Status: DC
  Administered 2021-12-31: 100 mg via ORAL
  Filled 2021-12-31: qty 4

## 2021-12-31 MED ORDER — AMLODIPINE BESYLATE 5 MG PO TABS
10.0000 mg | ORAL_TABLET | Freq: Every day | ORAL | Status: DC
  Administered 2021-12-31: 10 mg via ORAL
  Filled 2021-12-31: qty 2

## 2021-12-31 MED ORDER — CLONIDINE HCL 0.2 MG PO TABS: 0.2000 mg | ORAL_TABLET | Freq: Two times a day (BID) | ORAL | 0 refills | Status: DC

## 2021-12-31 MED ORDER — HYDROCHLOROTHIAZIDE 25 MG PO TABS
25.0000 mg | ORAL_TABLET | Freq: Every day | ORAL | Status: DC
  Administered 2021-12-31: 25 mg via ORAL
  Filled 2021-12-31 (×2): qty 1

## 2021-12-31 MED ORDER — LOSARTAN POTASSIUM-HCTZ 100-25 MG PO TABS: 1.0000 | ORAL_TABLET | Freq: Every day | ORAL | 0 refills | Status: DC

## 2021-12-31 NOTE — ED Notes (Signed)
Awaiting medication from Pharmacy. Will discharge after med administration.

## 2021-12-31 NOTE — Discharge Instructions (Signed)
Please call your family doctor today to set up an appointment.  I have represcribed your blood pressure medications.  Please take them as prescribed.  Please return for worsening headache one-sided numbness or weakness difficulty speech or swallowing or if you develop chest pain or difficulty breathing.

## 2021-12-31 NOTE — ED Provider Notes (Signed)
[facility] DEPT Provider Note   CSN: [id number] Arrival date & time: 12/31/21  1002     History  Chief Complaint  Patient presents with   Hypertension    Bethany Ball is a 31 y.o. female.  31 yo F with a cc of HTN.  Patient with some mild blurred vision off and on.  Had been seen by optho and dentistry who both encouraged her to come to the ED.  Has taken some BC powder off and on but denies recent or daily use.  Didn't get her BP meds refilled.  Has had some headaches off and on but none recently.  Denies cp, sob.  Denies once sided numbness or weakness, difficulty with speech or swallowing.    Hypertension       Home Medications Prior to Admission medications   Medication Sig Start Date End Date Taking? Authorizing Provider  Aspirin-Salicylamide-Caffeine (BC HEADACHE PO) Take 1 packet by mouth daily as needed (headaches/pain).   Yes [provider]  amLODipine (NORVASC) 10 MG tablet Take 1 tablet (10 mg total) by mouth daily. 12/31/21 01/30/22  Deno Etienne, DO  cloNIDine (CATAPRES) 0.2 MG tablet Take 1 tablet (0.2 mg total) by mouth 2 (two) times daily. 12/31/21 01/30/22  Deno Etienne, DO  losartan-hydrochlorothiazide (HYZAAR) 100-25 MG tablet Take 1 tablet by mouth daily. 12/31/21 01/30/22  Deno Etienne, DO  potassium chloride SA (KLOR-CON M) 20 MEQ tablet Take 1 tablet (20 mEq total) by mouth daily. Patient not taking: Reported on 12/31/2021 06/23/21   Bo Merino I, NP  spironolactone (ALDACTONE) 25 MG tablet Take 1 tablet (25 mg total) by mouth daily. 12/31/21 01/30/22  Deno Etienne, DO      Allergies    Patient has no known allergies.    Review of Systems   Review of Systems  Physical Exam Updated Vital Signs BP (!) 200/135   Pulse 69   Temp 98.2 F (36.8 C) (Oral)   Resp 19   SpO2 100%  Physical Exam Vitals and nursing note reviewed.  Constitutional:      General: She is not in acute distress.    Appearance: She is  well-developed. She is not diaphoretic.  HENT:     Head: Normocephalic and atraumatic.  Eyes:     Pupils: Pupils are equal, round, and reactive to light.  Cardiovascular:     Rate and Rhythm: Normal rate and regular rhythm.     Heart sounds: No murmur heard.    No friction rub. No gallop.  Pulmonary:     Effort: Pulmonary effort is normal.     Breath sounds: No wheezing or rales.  Abdominal:     General: There is no distension.     Palpations: Abdomen is soft.     Tenderness: There is no abdominal tenderness.  Musculoskeletal:        General: No tenderness.     Cervical back: Normal range of motion and neck supple.  Skin:    General: Skin is warm and dry.  Neurological:     Mental Status: She is alert and oriented to person, place, and time.     GCS: GCS eye subscore is 4. GCS verbal subscore is 5. GCS motor subscore is 6.     Cranial Nerves: Cranial nerves 2-12 are intact.     Sensory: Sensation is intact.     Motor: Motor function is intact.     Coordination: Coordination is intact.     Comments: Benign  neuro exam, ambulates without difficulty.   Psychiatric:        Behavior: Behavior normal.     ED Results / Procedures / Treatments   Labs (all labs ordered are listed, but only abnormal results are displayed) Labs Reviewed  CBC WITH DIFFERENTIAL/PLATELET - Abnormal; Notable for the following components:      Result Value   WBC 12.8 (*)    Neutro Abs 9.2 (*)    All other components within normal limits  BASIC METABOLIC PANEL - Abnormal; Notable for the following components:   CO2 21 (*)    Glucose, Bld 103 (*)    Creatinine, Ser 1.01 (*)    All other components within normal limits  I-STAT BETA HCG BLOOD, ED ([facility], [facility], [facility] ONLY)    EKG None  Radiology No results found.  Procedures Procedures    Medications Ordered in ED Medications  amLODipine (NORVASC) tablet 10 mg (10 mg Oral Given 12/31/21 1038)  cloNIDine (CATAPRES) tablet 0.2 mg (0.2 mg Oral Given  12/31/21 1038)  spironolactone (ALDACTONE) tablet 25 mg (has no administration in time range)  losartan (COZAAR) tablet 100 mg (has no administration in time range)    And  hydrochlorothiazide (HYDRODIURIL) tablet 25 mg (has no administration in time range)    ED Course/ Medical Decision Making/ A&P                           Medical Decision Making Amount and/or Complexity of Data Reviewed Labs: ordered.  Risk Prescription drug management.   31 yo F with a cc of HTN.  Hx of the same, required hospitalization and was started on multiple medications.  Patient ran out of meds, has had some blurry vision off and on.  Seen by optho and dentistry and recommended to come here for eval.    No complaints currently, benign neuro exam.  Will restart home meds here.  Basic labs.    Blood work without any change to her renal function.  Patient's blood pressure has trended downward with her home medications.  Represcribed here.  PCP follow-up.  12:06 PM:  I have discussed the diagnosis/risks/treatment options with the patient.  Evaluation and diagnostic testing in the emergency department does not suggest an emergent condition requiring admission or immediate intervention beyond what has been performed at this time.  They will follow up with  PCP. We also discussed returning to the ED immediately if new or worsening sx occur. We discussed the sx which are most concerning (e.g., sudden worsening pain, fever, inability to tolerate by mouth) that necessitate immediate return. Medications administered to the patient during their visit and any new prescriptions provided to the patient are listed below.  Medications given during this visit Medications  amLODipine (NORVASC) tablet 10 mg (10 mg Oral Given 12/31/21 1038)  cloNIDine (CATAPRES) tablet 0.2 mg (0.2 mg Oral Given 12/31/21 1038)  spironolactone (ALDACTONE) tablet 25 mg (has no administration in time range)  losartan (COZAAR) tablet 100 mg (has no  administration in time range)    And  hydrochlorothiazide (HYDRODIURIL) tablet 25 mg (has no administration in time range)     The patient appears reasonably screen and/or stabilized for discharge and I doubt any other medical condition or other [facility] requiring further screening, evaluation, or treatment in the ED at this time prior to discharge.          Final Clinical Impression(s) / ED Diagnoses Final diagnoses:  Hypertension, unspecified  type    Rx / DC Orders ED Discharge Orders          Ordered    amLODipine (NORVASC) 10 MG tablet  Daily        12/31/21 1200    cloNIDine (CATAPRES) 0.2 MG tablet  2 times daily        12/31/21 1200    losartan-hydrochlorothiazide (HYZAAR) 100-25 MG tablet  Daily        12/31/21 1200    spironolactone (ALDACTONE) 25 MG tablet  Daily        12/31/21 1200              Deno Etienne, DO 12/31/21 1206

## 2021-12-31 NOTE — ED Triage Notes (Signed)
Pt arrived via POV, c/o HTN. States hx of same, unable to get medications at this time. States she has had on and off blurred vision since time without medication. Denies any headache, SOB, or CP

## 2022-04-17 ENCOUNTER — Ambulatory Visit
Admission: EM | Admit: 2022-04-17 | Discharge: 2022-04-17 | Disposition: A | Discharge status: Home / Self Care | Payer: 59

## 2022-04-17 DIAGNOSIS — J018 Other acute sinusitis: Secondary | ICD-10-CM

## 2022-04-17 DIAGNOSIS — I10 Essential (primary) hypertension: Secondary | ICD-10-CM

## 2022-04-17 DIAGNOSIS — J309 Allergic rhinitis, unspecified: Secondary | ICD-10-CM

## 2022-04-17 DIAGNOSIS — H5789 Other specified disorders of eye and adnexa: Secondary | ICD-10-CM

## 2022-04-17 LAB — POCT RAPID STREP A (OFFICE): Rapid Strep A Screen: NEGATIVE

## 2022-04-17 MED ORDER — AMOXICILLIN-POT CLAVULANATE 875-125 MG PO TABS: 1.0000 | ORAL_TABLET | Freq: Two times a day (BID) | ORAL | 0 refills | Status: DC

## 2022-04-17 MED ORDER — TOBRAMYCIN 0.3 % OP SOLN: 1.0000 [drp] | OPHTHALMIC | 0 refills | Status: DC

## 2022-04-17 MED ORDER — CETIRIZINE HCL 10 MG PO TABS: 10.0000 mg | ORAL_TABLET | Freq: Every day | ORAL | 3 refills | Status: DC

## 2022-04-17 NOTE — Discharge Instructions (Addendum)

## 2022-04-17 NOTE — ED Provider Notes (Signed)
[facility]  Note:  This document was prepared using Systems analyst and may include unintentional dictation errors.  MRN: [id number] DOB: 1990/04/25  Subjective:   Bethany Ball is a 32 y.o. female presenting for 2-week history of acute onset persistent sneezing, sinus congestion, sinus pressure, throat pain, hoarseness.  Woke up with both eyes irritated, red and matted shut.  No chest pain, shortness of breath or wheezing.  Regarding her blood pressure, takes her blood pressure medications consistently.  No history of stroke, heart disease.  No drug use.  Patient is a smoker.  No current facility-administered medications for this encounter.  Current Outpatient Medications:    nebivolol (BYSTOLIC) 10 MG tablet, Take 10 mg by mouth daily., Disp: , Rfl:    amLODipine (NORVASC) 10 MG tablet, Take 1 tablet (10 mg total) by mouth daily., Disp: 30 tablet, Rfl: 0   Aspirin-Salicylamide-Caffeine (BC HEADACHE PO), Take 1 packet by mouth daily as needed (headaches/pain)., Disp: , Rfl:    cloNIDine (CATAPRES) 0.2 MG tablet, Take 1 tablet (0.2 mg total) by mouth 2 (two) times daily., Disp: 60 tablet, Rfl: 0   losartan-hydrochlorothiazide (HYZAAR) 100-25 MG tablet, Take 1 tablet by mouth daily., Disp: 30 tablet, Rfl: 0   potassium chloride SA (KLOR-CON M) 20 MEQ tablet, Take 1 tablet (20 mEq total) by mouth daily. (Patient not taking: Reported on 12/31/2021), Disp: 30 tablet, Rfl: 3   spironolactone (ALDACTONE) 25 MG tablet, Take 1 tablet (25 mg total) by mouth daily., Disp: 30 tablet, Rfl: 0   No Known Allergies  Past Medical History:  Diagnosis Date   Hypertension    does not take her meds that are prescribed     History reviewed. No pertinent surgical history.  Family History  Problem Relation Age of Onset   Hypertension Mother    Hypertension Father    Stroke Father     Social History   Tobacco Use   Smoking status: Every Day    Types:  Cigarettes   Smokeless tobacco: Never  Vaping Use   Vaping Use: Never used  Substance Use Topics   Alcohol use: Never   Drug use: Never    ROS   Objective:   Vitals: BP (!) 190/109 (BP Location: Right Arm)   Pulse 82   Temp 98.3 F (36.8 C) (Oral)   Resp 20   LMP 04/14/2022   SpO2 98%   BP Readings from Last 3 Encounters:  04/17/22 (!) 190/109  12/31/21 (!) 198/139  07/17/21 (!) 140/93   Physical Exam Constitutional:      General: She is not in acute distress.    Appearance: Normal appearance. She is well-developed and normal weight. She is not ill-appearing, toxic-appearing or diaphoretic.  HENT:     Head: Normocephalic and atraumatic.     Right Ear: Tympanic membrane, ear canal and external ear normal. No drainage or tenderness. No middle ear effusion. There is no impacted cerumen. Tympanic membrane is not erythematous or bulging.     Left Ear: Tympanic membrane, ear canal and external ear normal. No drainage or tenderness.  No middle ear effusion. There is no impacted cerumen. Tympanic membrane is not erythematous or bulging.     Nose: Congestion and rhinorrhea present.     Mouth/Throat:     Mouth: Mucous membranes are moist. No oral lesions.     Pharynx: No pharyngeal swelling, oropharyngeal exudate, posterior oropharyngeal erythema or uvula swelling.     Tonsils: No tonsillar  exudate or tonsillar abscesses.     Comments: Hoarseness noted. Eyes:     General: No scleral icterus.       Right eye: Discharge present.        Left eye: Discharge present.    Extraocular Movements: Extraocular movements intact.     Right eye: Normal extraocular motion.     Left eye: Normal extraocular motion.     Comments: Conjunctive injected bilaterally with matting of the eyelashes.  Cardiovascular:     Rate and Rhythm: Normal rate and regular rhythm.     Heart sounds: Normal heart sounds. No murmur heard.    No friction rub. No gallop.  Pulmonary:     Effort: Pulmonary effort is  normal. No respiratory distress.     Breath sounds: No stridor. No wheezing, rhonchi or rales.  Chest:     Chest wall: No tenderness.  Musculoskeletal:     Cervical back: Normal range of motion and neck supple.  Lymphadenopathy:     Cervical: No cervical adenopathy.  Skin:    General: Skin is warm and dry.  Neurological:     General: No focal deficit present.     Mental Status: She is alert and oriented to person, place, and time.     Cranial Nerves: No cranial nerve deficit.     Motor: No weakness.     Coordination: Coordination normal.     Gait: Gait normal.  Psychiatric:        Mood and Affect: Mood normal.        Behavior: Behavior normal.        Thought Content: Thought content normal.        Judgment: Judgment normal.     Results for orders placed or performed during the hospital encounter of 04/17/22 (from the past 24 hour(s))  POCT rapid strep A     Status: None   Collection Time: 04/17/22  1:26 PM  Result Value Ref Range   Rapid Strep A Screen Negative Negative    Assessment and Plan :   PDMP not reviewed this encounter.  1. Acute non-recurrent sinusitis of other sinus   2. Allergic rhinitis, unspecified seasonality, unspecified trigger   3. Essential hypertension   4. Redness of both eyes     Suspected pansinusitis and therefore recommended Augmentin.  Use supportive care otherwise.  Regarding her blood pressure, emphasized strict low-sodium diet, maintenance of her medications.  No signs of an acute encephalopathy, endorgan damage from her hypertension.  Follow-up with PCP as soon as possible. Deferred imaging given clear cardiopulmonary exam, hemodynamically stable vital signs. Counseled patient on potential for adverse effects with medications prescribed/recommended today, ER and return-to-clinic precautions discussed, patient verbalized understanding.    Jaynee Eagles, [place] 04/17/22 1442

## 2022-04-17 NOTE — ED Triage Notes (Signed)
Pt c/o nasal congestion, sneeze and cough 2 weeks ago-c/o sore throat, hoarse x 2 days and bilat redness x today-NAD-steady gait

## 2022-09-07 ENCOUNTER — Emergency Department (HOSPITAL_COMMUNITY)
Admission: EM | Admit: 2022-09-07 | Discharge: 2022-09-07 | Disposition: A | Discharge status: Home / Self Care | Payer: No Typology Code available for payment source | Attending: Emergency Medicine | Admitting: Emergency Medicine

## 2022-09-07 ENCOUNTER — Other Ambulatory Visit: Payer: Self-pay

## 2022-09-07 ENCOUNTER — Emergency Department (HOSPITAL_COMMUNITY): Payer: No Typology Code available for payment source

## 2022-09-07 ENCOUNTER — Encounter (HOSPITAL_COMMUNITY): Payer: Self-pay

## 2022-09-07 DIAGNOSIS — Z79899 Other long term (current) drug therapy: Secondary | ICD-10-CM | POA: Insufficient documentation

## 2022-09-07 DIAGNOSIS — I10 Essential (primary) hypertension: Secondary | ICD-10-CM | POA: Diagnosis not present

## 2022-09-07 DIAGNOSIS — R519 Headache, unspecified: Secondary | ICD-10-CM | POA: Diagnosis present

## 2022-09-07 LAB — CBC
HCT: 38.7 % (ref 36.0–46.0)
Hemoglobin: 12.3 g/dL (ref 12.0–15.0)
MCH: 27.7 pg (ref 26.0–34.0)
MCHC: 31.8 g/dL (ref 30.0–36.0)
MCV: 87.2 fL (ref 80.0–100.0)
Platelets: 346 10*3/uL (ref 150–400)
RBC: 4.44 MIL/uL (ref 3.87–5.11)
RDW: 13.9 % (ref 11.5–15.5)
WBC: 10.8 10*3/uL — ABNORMAL HIGH (ref 4.0–10.5)
nRBC: 0 % (ref 0.0–0.2)

## 2022-09-07 LAB — BASIC METABOLIC PANEL
Anion gap: 11 (ref 5–15)
BUN: 14 mg/dL (ref 6–20)
CO2: 23 mmol/L (ref 22–32)
Calcium: 8.9 mg/dL (ref 8.9–10.3)
Chloride: 103 mmol/L (ref 98–111)
Creatinine, Ser: 1.07 mg/dL — ABNORMAL HIGH (ref 0.44–1.00)
GFR, Estimated: >60 mL/min (ref >60)
Glucose, Bld: 117 mg/dL — ABNORMAL HIGH (ref 70–99)
Potassium: 3.3 mmol/L — ABNORMAL LOW (ref 3.5–5.1)
Sodium: 137 mmol/L (ref 135–145)

## 2022-09-07 LAB — I-STAT BETA HCG BLOOD, ED (MC, WL, AP ONLY): I-stat hCG, quantitative: <5 m[IU]/mL (ref <5)

## 2022-09-07 LAB — TROPONIN I (HIGH SENSITIVITY)
Troponin I (High Sensitivity): 13 ng/L (ref <18)
Troponin I (High Sensitivity): 15 ng/L (ref <18)

## 2022-09-07 MED ORDER — NEBIVOLOL HCL 10 MG PO TABS: 10.0000 mg | ORAL_TABLET | Freq: Every day | ORAL | 0 refills | Status: DC

## 2022-09-07 MED ORDER — LOSARTAN POTASSIUM-HCTZ 100-25 MG PO TABS: 1.0000 | ORAL_TABLET | Freq: Every day | ORAL | 0 refills | Status: DC

## 2022-09-07 MED ORDER — AMLODIPINE BESYLATE 10 MG PO TABS: 10.0000 mg | ORAL_TABLET | Freq: Every day | ORAL | 0 refills | Status: DC

## 2022-09-07 MED ORDER — SPIRONOLACTONE 25 MG PO TABS: 25.0000 mg | ORAL_TABLET | Freq: Every day | ORAL | 0 refills | Status: DC

## 2022-09-07 MED ORDER — LABETALOL HCL 5 MG/ML IV SOLN
20.0000 mg | Freq: Once | INTRAVENOUS | Status: AC
  Administered 2022-09-07: 20 mg via INTRAVENOUS
  Filled 2022-09-07: qty 4

## 2022-09-07 MED ORDER — HYDRALAZINE HCL 20 MG/ML IJ SOLN
10.0000 mg | Freq: Once | INTRAMUSCULAR | Status: AC
  Administered 2022-09-07: 10 mg via INTRAVENOUS
  Filled 2022-09-07: qty 1

## 2022-09-07 NOTE — Discharge Instructions (Addendum)
You came to the emergency department today with uncontrolled blood pressure.  Your blood pressure came down with IV medications.  I have refilled all of your medications and they are at your pharmacy.  I have also attached an office that you may call to get established with a primary care.  If they cannot see you please call anybody else in the area.  Please return to the emergency department with any chest pain, severe headaches, blurred vision, back pain or further concerns.  It was a pleasure to meet you and I hope you continue to feel better.

## 2022-09-07 NOTE — ED Triage Notes (Signed)
Reports ran out of BP meds and trying to find new primary.  Reports chest pain last night when she laid down and having a slight headache and back pain this morning.

## 2022-09-07 NOTE — ED Provider Notes (Signed)
[place] [facility] EMERGENCY DEPARTMENT AT [facility] Provider Note   CSN: [id number] Arrival date & time: 09/07/22  1029     History No chief complaint on file.   Bethany Ball is a 32 y.o. female with a past medical history of hypertension presenting today with concern for hypertension.  She reports she has been out of her medications for the past 2 weeks and is trying to get a new PCP.  Over the past week she has been trying to check her blood pressure at home however her cuff reads "error" when she uses it on either arm.  Says that sometimes she has some discomfort in her head that she describes as dull.  No worsening vision, numbness, tingling or unilateral weakness.  Yesterday she had a moment where she had some sternal discomfort and back pain however this resolved on its own.  HPI     Home Medications Prior to Admission medications   Medication Sig Start Date End Date Taking? Authorizing Provider  amLODipine (NORVASC) 10 MG tablet Take 1 tablet (10 mg total) by mouth daily. 12/31/21 01/30/22  Melene Plan, DO  amoxicillin-clavulanate (AUGMENTIN) 875-125 MG tablet Take 1 tablet by mouth 2 (two) times daily. 04/17/22   Wallis Bamberg, PA-C  Aspirin-Salicylamide-Caffeine (BC HEADACHE PO) Take 1 packet by mouth daily as needed (headaches/pain).    [provider]  cetirizine (ZYRTEC ALLERGY) 10 MG tablet Take 1 tablet (10 mg total) by mouth daily. 04/17/22   Wallis Bamberg, PA-C  cloNIDine (CATAPRES) 0.2 MG tablet Take 1 tablet (0.2 mg total) by mouth 2 (two) times daily. 12/31/21 01/30/22  Melene Plan, DO  losartan-hydrochlorothiazide (HYZAAR) 100-25 MG tablet Take 1 tablet by mouth daily. 12/31/21 01/30/22  Melene Plan, DO  nebivolol (BYSTOLIC) 10 MG tablet Take 10 mg by mouth daily.    [provider]  potassium chloride SA (KLOR-CON M) 20 MEQ tablet Take 1 tablet (20 mEq total) by mouth daily. Patient not taking: Reported on 12/31/2021 06/23/21   Orion Crook I, NP   spironolactone (ALDACTONE) 25 MG tablet Take 1 tablet (25 mg total) by mouth daily. 12/31/21 01/30/22  Melene Plan, DO  tobramycin (TOBREX) 0.3 % ophthalmic solution Place 1 drop into both eyes every 4 (four) hours. 04/17/22   Wallis Bamberg, PA-C      Allergies    Patient has no known allergies.    Review of Systems   Review of Systems  Physical Exam Updated Vital Signs There were no vitals taken for this visit. Physical Exam Vitals and nursing note reviewed.  Constitutional:      Appearance: Normal appearance.  HENT:     Head: Normocephalic and atraumatic.  Eyes:     General: No scleral icterus.    Conjunctiva/sclera: Conjunctivae normal.  Pulmonary:     Effort: Pulmonary effort is normal. No respiratory distress.  Skin:    Findings: No rash.  Neurological:     Mental Status: She is alert.  Psychiatric:        Mood and Affect: Mood normal.     ED Results / Procedures / Treatments   Labs (all labs ordered are listed, but only abnormal results are displayed) Labs Reviewed - No data to display  EKG None  Radiology No results found.  Procedures Procedures   Medications Ordered in ED Medications - No data to display  ED Course/ Medical Decision Making/ A&P  Medical Decision Making Amount and/or Complexity of Data Reviewed Labs: ordered. Radiology: ordered.  Risk Prescription drug management.    32 year old female presenting today with hypertension.  Differential includes but is not limited to renal dysfunction, pheochromocytoma.  Considerations include hypertensive emergency such as CVA or ACS.  Also concern for hypertensive urgency.  This is not an exhaustive differential.    Past Medical History / Co-morbidities / Social History: Uncontrolled hypertension, currently not established with PCP   Additional history: Per chart review patient presented to the emergency department in March 2023 with elevated blood pressures.  She  was offered discharge home however continued to have worsening headaches so she was admitted for hypertensive urgency.  Subsequently discharged with internal medicine PCP.   Physical Exam: Pertinent physical exam findings include Resting comfortably Neurologically intact Regular rhythm, tachycardic  Lab Tests: I ordered, and personally interpreted labs.  The pertinent results include: Creatinine 1.07, normal GFR Negative troponin x2   Imaging Studies: I ordered and independently visualized and interpreted chest x-ray and head CT and I agree with the radiologist that there are no acute findings   Cardiac Monitoring:  The patient was maintained on a cardiac monitor.  I viewed and interpreted the cardiac monitored which showed an underlying rhythm of: Sinus   Medications: I ordered medication including labetalol for her blood pressure and tachycardia.  Repeat blood pressure 192/112.  Hydralazine was ordered and patient's blood pressure came down to 170s systolic over 90s diastolic.  MDM/Disposition: This is a 32 year old female presenting today with uncontrolled hypertension.  She has been out of her medications for 2 weeks.  Presenting today with some intermittent chest discomfort and headaches.  Lab work reassuring.  Negative troponin.  Originally presented with systolic blood pressures in the 260s and diastolic in the 160s.  She was tachycardic on arrival as well.  Labetalol was initiated for hypertension and tachycardia and her blood pressure responded fairly well however she continued to be hypertensive 190s/1010s.  Hydralazine ordered as patient has a history of success with this medication.  On reevaluation blood pressure 170s over 90s.  She was offered 1 more dose of IV medication and continued monitoring however she prefers to go home and pick up her outpatient prescription.  I went through the patient's current medications with her.  She is currently taking amlodipine, losartan,  nebivolol and spironolactone.  All of these have been sent to her pharmacy.  Return precautions were discussed and she was discharged home.   Final Clinical Impression(s) / ED Diagnoses Final diagnoses:  Severe uncontrolled hypertension    Rx / DC Orders ED Discharge Orders          Ordered    amLODipine (NORVASC) 10 MG tablet  Daily        09/07/22 1054    losartan-hydrochlorothiazide (HYZAAR) 100-25 MG tablet  Daily        09/07/22 1054    spironolactone (ALDACTONE) 25 MG tablet  Daily        09/07/22 1054    nebivolol (BYSTOLIC) 10 MG tablet  Daily        09/07/22 1054           Results and diagnoses were explained to the patient. Return precautions discussed in full. Patient had no additional questions and expressed complete understanding.   This chart was dictated using voice recognition software.  Despite best efforts to proofread,  errors can occur which can change the documentation meaning.    Anara Cowman A,  PA-C 09/07/22 1525    Linwood Dibbles, MD 09/08/22 [phone]

## 2022-09-07 NOTE — ED Notes (Signed)
This RN reviewed discharge instructions with patient. She verbalized understanding and denied any further questions. PT well appearing upon discharge and reports tolerable pain. Pt ambulated with stable gait to exit. Pt endorses ride home.  

## 2022-09-07 NOTE — ED Notes (Signed)
Provider at bedside

## 2022-09-07 NOTE — ED Notes (Signed)
X-ray at bedside

## 2023-02-02 IMAGING — CT CT HEAD W/O CM
4 series · 16 of 47 positions shown, 18 images · non-contrast
Comparison: None.

CLINICAL DATA: Headache.



[Series 3: head without · axial · non-contrast · 0.43mm/px · z∈[-51,+59]mm · 7 of 30 slices shown, 9 images]
[im 4/30  brain]
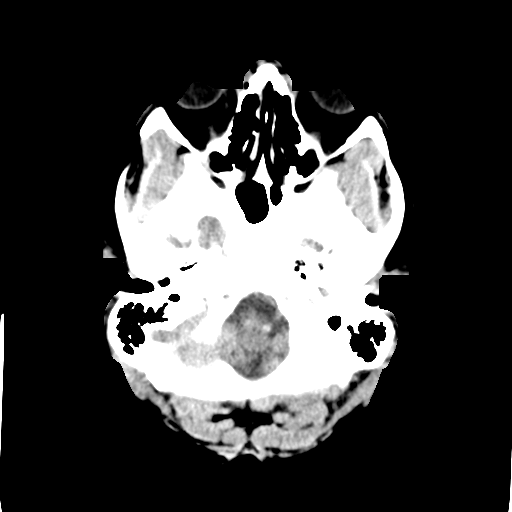
[im 4/30  bone]
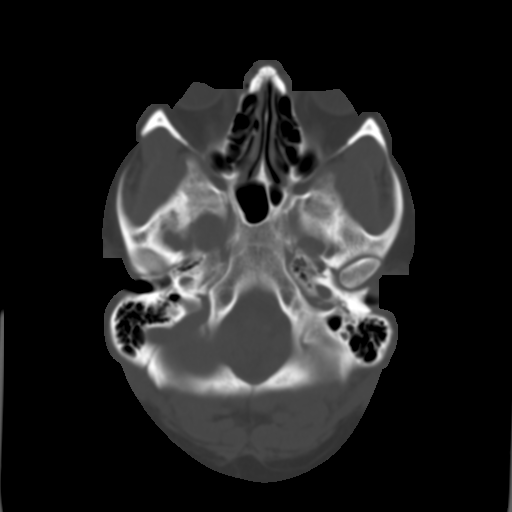
[im 8/30  brain]
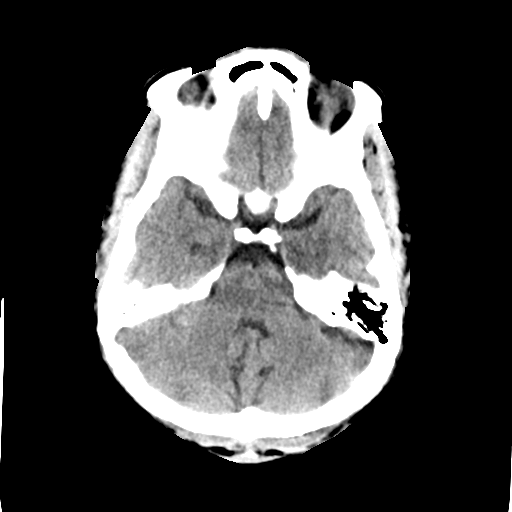
[im 11/30  brain]
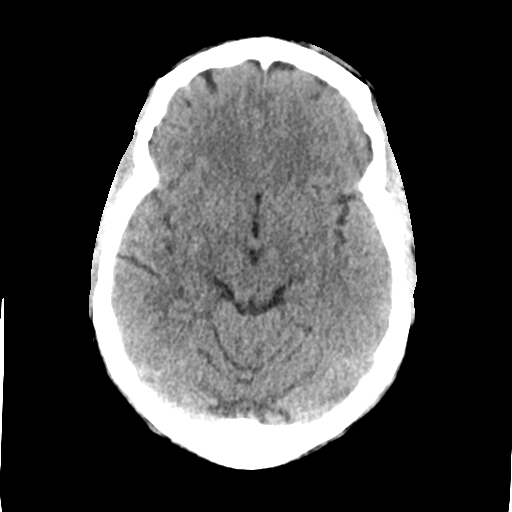
[im 15/30  brain]
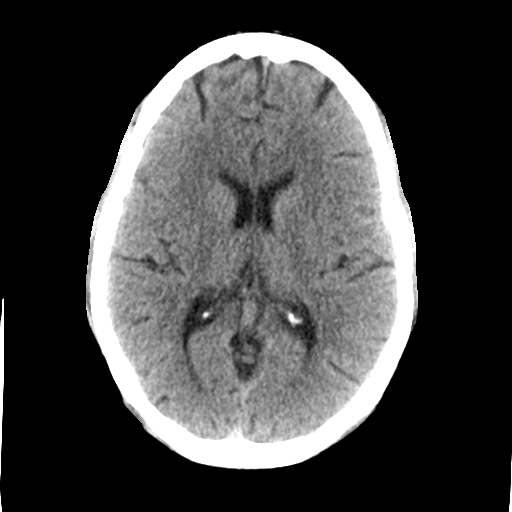
[im 19/30  brain]
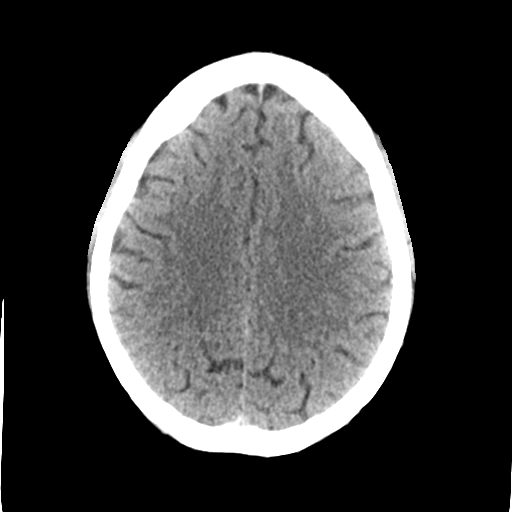
[im 19/30  bone]
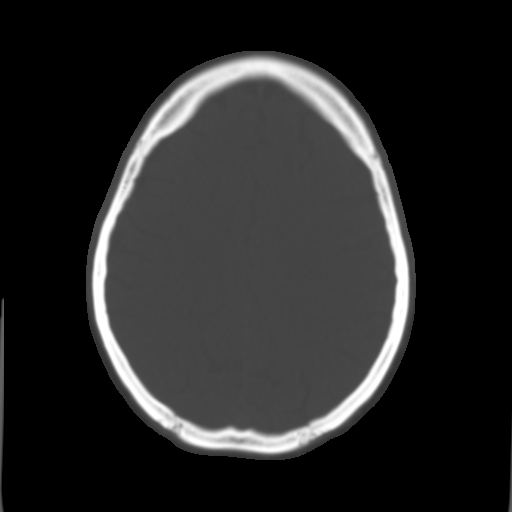
[im 22/30  brain]
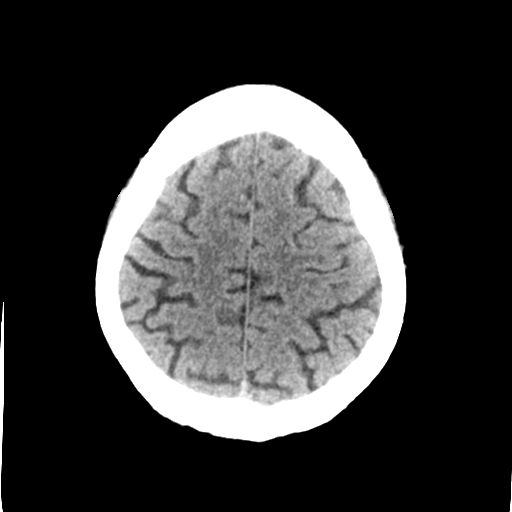
[im 26/30  brain]
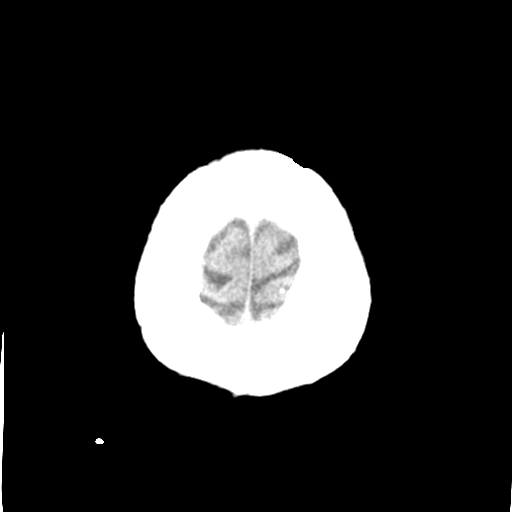

[Series 4: head bone · axial · 0.43mm/px · z∈[-52,-22]mm · 3 of 75 slices shown]
[im 8/75  bone]
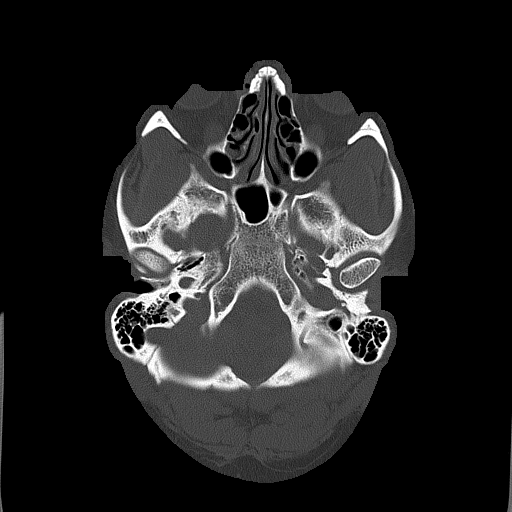
[im 15/75  bone]
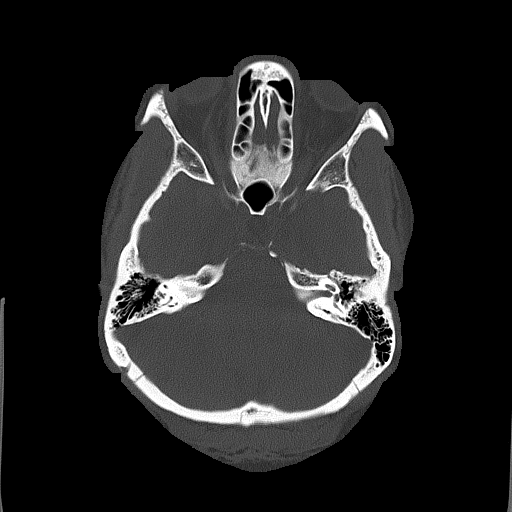
[im 23/75  bone]
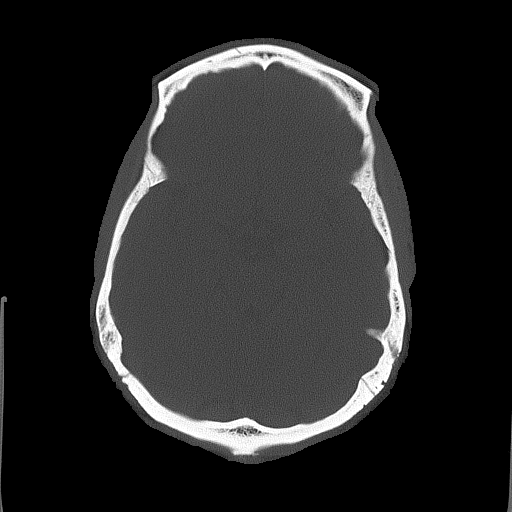

[Series 5: head without cor · coronal · non-contrast · 0.32mm/px · 3 of 69 slices shown]
[im 23/69  brain]
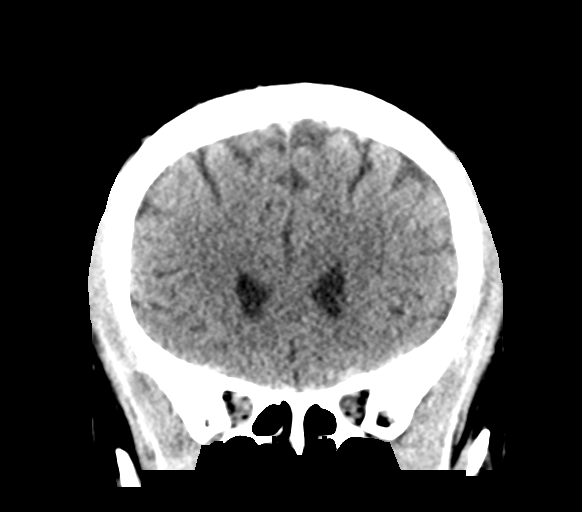
[im 31/69  brain]
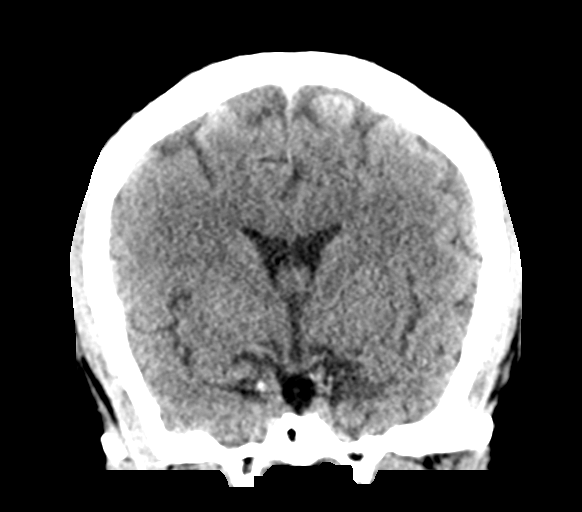
[im 38/69  brain]
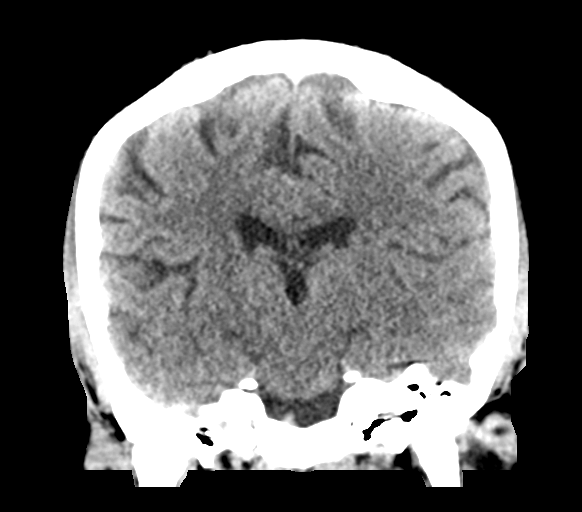

[Series 6: head without sag · sagittal · non-contrast · 0.31mm/px · 3 of 54 slices shown]
[im 18/54  brain]
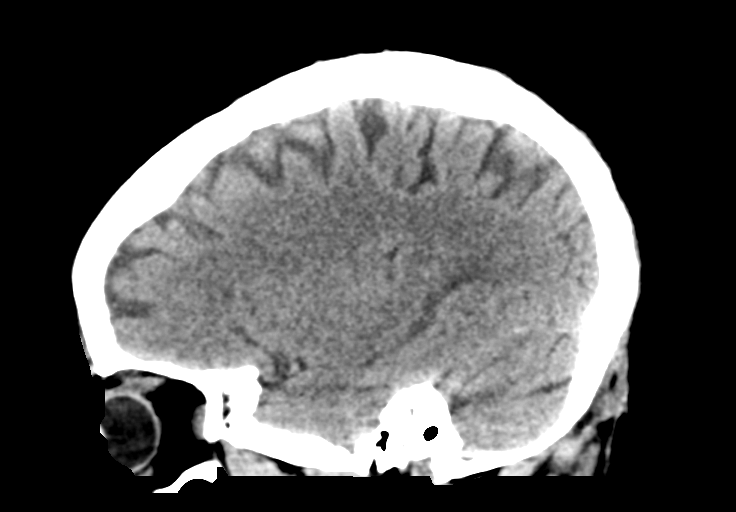
[im 27/54  brain]
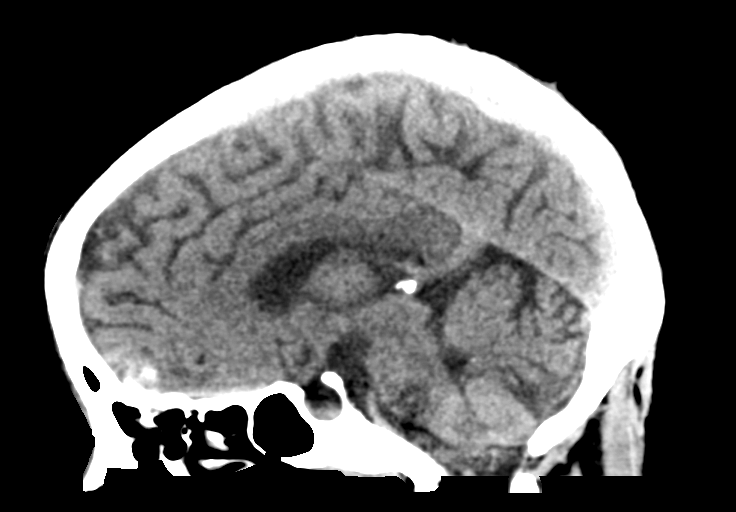
[im 36/54  brain]
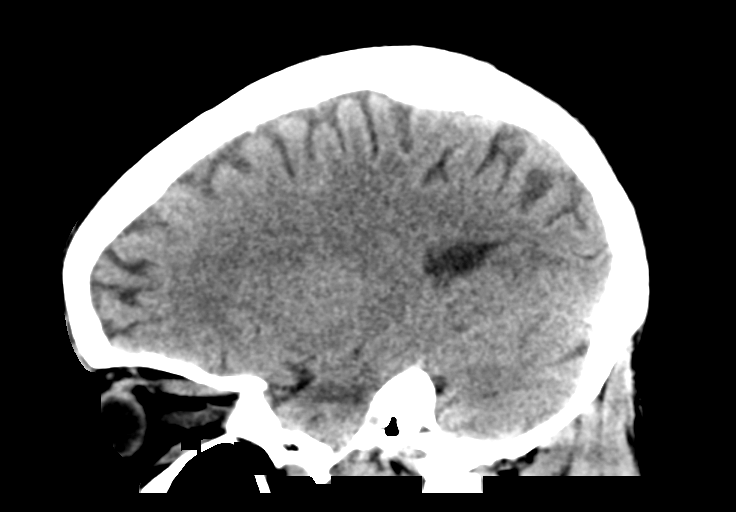

[16 of 47 positions shown; findings below may reference images not displayed]

FINDINGS: Brain: No evidence of acute infarction, hemorrhage, hydrocephalus,
extra-axial collection or mass lesion/mass effect.

Vascular: No hyperdense vessel or unexpected calcification.

Skull: Normal. Negative for fracture or focal lesion.

Sinuses/Orbits: No acute finding.

Other: None.
IMPRESSION: No acute intracranial abnormality seen.

## 2023-10-27 ENCOUNTER — Other Ambulatory Visit: Payer: Self-pay

## 2023-10-27 ENCOUNTER — Ambulatory Visit
Admission: EM | Admit: 2023-10-27 | Discharge: 2023-10-27 | Disposition: A | Discharge status: Home / Self Care | Attending: Nurse Practitioner | Admitting: Nurse Practitioner

## 2023-10-27 DIAGNOSIS — I10 Essential (primary) hypertension: Secondary | ICD-10-CM

## 2023-10-27 DIAGNOSIS — Z91199 Patient's noncompliance with other medical treatment and regimen due to unspecified reason: Secondary | ICD-10-CM | POA: Diagnosis not present

## 2023-10-27 DIAGNOSIS — H00022 Hordeolum internum right lower eyelid: Secondary | ICD-10-CM

## 2023-10-27 DIAGNOSIS — I160 Hypertensive urgency: Secondary | ICD-10-CM

## 2023-10-27 DIAGNOSIS — E876 Hypokalemia: Secondary | ICD-10-CM

## 2023-10-27 MED ORDER — CLONIDINE HCL 0.1 MG PO TABS
0.1000 mg | ORAL_TABLET | Freq: Once | ORAL | Status: AC
  Administered 2023-10-27: 0.1 mg via ORAL

## 2023-10-27 MED ORDER — SPIRONOLACTONE 25 MG PO TABS: 25.0000 mg | ORAL_TABLET | Freq: Every day | ORAL | 0 refills | Status: DC

## 2023-10-27 MED ORDER — ERYTHROMYCIN 5 MG/GM OP OINT: TOPICAL_OINTMENT | OPHTHALMIC | 0 refills | Status: DC

## 2023-10-27 MED ORDER — METOPROLOL TARTRATE 50 MG PO TABS
50.0000 mg | ORAL_TABLET | Freq: Once | ORAL | Status: AC
  Administered 2023-10-27: 50 mg via ORAL

## 2023-10-27 MED ORDER — LOSARTAN POTASSIUM-HCTZ 100-25 MG PO TABS: 1.0000 | ORAL_TABLET | Freq: Every day | ORAL | 0 refills | Status: DC

## 2023-10-27 MED ORDER — POTASSIUM CHLORIDE CRYS ER 20 MEQ PO TBCR: 20.0000 meq | EXTENDED_RELEASE_TABLET | Freq: Every day | ORAL | 0 refills | Status: DC

## 2023-10-27 MED ORDER — CLONIDINE HCL 0.2 MG PO TABS
0.2000 mg | ORAL_TABLET | Freq: Once | ORAL | Status: AC
  Administered 2023-10-27: 0.2 mg via ORAL

## 2023-10-27 MED ORDER — CETIRIZINE HCL 10 MG PO TABS: 10.0000 mg | ORAL_TABLET | Freq: Every day | ORAL | 0 refills | Status: DC

## 2023-10-27 MED ORDER — ASPIRIN 81 MG PO CHEW: 81.0000 mg | CHEWABLE_TABLET | Freq: Every day | ORAL | 0 refills | Status: AC

## 2023-10-27 MED ORDER — NEBIVOLOL HCL 10 MG PO TABS: 10.0000 mg | ORAL_TABLET | Freq: Every day | ORAL | 0 refills | Status: DC

## 2023-10-27 MED ORDER — AMLODIPINE BESYLATE 10 MG PO TABS: 10.0000 mg | ORAL_TABLET | Freq: Every day | ORAL | 0 refills | Status: DC

## 2023-10-27 NOTE — ED Provider Notes (Signed)
 [facility]    CSN: [id number] Arrival date & time: 10/27/23  1644      History   Chief Complaint No chief complaint on file.   HPI Bethany Ball  is a 33 y.o. female.   Discussed the use of AI scribe software for clinical note transcription with the patient, who gave verbal consent to proceed.   The patient presents with a one-day history of redness and swelling of the right lower eyelid. She reports no associated trauma, blurred vision, or upper respiratory or allergy symptoms. 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  She has a longstanding history of hypertension diagnosed at age 37 and a known pattern of nonadherence to prescribed antihypertensive medications. She has previously required hospitalization for hypertensive urgency that was difficult to manage. Although she was once prescribed four different blood pressure medications, she admits to not taking them for some time due to being uninsured and not having a primary care provider. She now has health insurance and expresses interest in reestablishing care, specifically requesting a provider of color and preferably a Black female physician.  Currently, she denies any symptoms concerning for hypertensive complications, including headache, dizziness, visual changes, chest pain, palpitations, shortness of breath, nausea, or vomiting. She reports feeling well overall and was able to work a full day without issue.  The following portions of the patient's history were reviewed and updated as appropriate: allergies, current medications, past family history, past medical history, past social history, past surgical history, and problem list.      Past Medical History:  Diagnosis Date   Hypertension    does not take her meds that are prescribed    Patient Active Problem List   Diagnosis Date Noted   Severe hypertension 06/13/2021   Severe uncontrolled  hypertension 06/12/2021    History reviewed. No pertinent surgical history.  OB History   No obstetric history on file.      Home Medications    Prior to Admission medications   Medication Sig Start Date End Date Taking? Authorizing Provider  aspirin  81 MG chewable tablet Chew 1 tablet (81 mg total) by mouth daily. 10/27/23  Yes Iola Lukes, FNP  cetirizine  (ZYRTEC ) 10 MG tablet Take 1 tablet (10 mg total) by mouth daily. 10/27/23  Yes Iola Lukes, FNP  erythromycin  ophthalmic ointment Place a 1/2 inch ribbon of ointment into the right lower eyelid 4 times a day for 7 days. 10/27/23  Yes Iola Lukes, FNP  amLODipine  (NORVASC ) 10 MG tablet Take 1 tablet (10 mg total) by mouth daily. 10/27/23 11/26/23  Edyth Glomb, FNP  losartan -hydrochlorothiazide  (HYZAAR) 100-25 MG tablet Take 1 tablet by mouth daily. 10/27/23 11/26/23  Iola Lukes, FNP  nebivolol  (BYSTOLIC ) 10 MG tablet Take 1 tablet (10 mg total) by mouth daily. 10/27/23   Iola Lukes, FNP  potassium chloride  SA (KLOR-CON  M) 20 MEQ tablet Take 1 tablet (20 mEq total) by mouth daily. 10/27/23   Tommaso Cavitt, FNP  spironolactone  (ALDACTONE ) 25 MG tablet Take 1 tablet (25 mg total) by mouth daily. 10/27/23 11/26/23  Iola Lukes, FNP    Family History Family History  Problem Relation Age of Onset   Hypertension Mother    Hypertension Father    Stroke Father     Social History Social History   Tobacco Use   Smoking status: Every Day    Types: Cigarettes   Smokeless tobacco: Never  Vaping Use   Vaping status: Never Used  Substance Use Topics   Alcohol use: Never   Drug use: Never     Allergies   Patient has no known allergies.   Review of Systems Review of Systems  Eyes:  Negative for photophobia and visual disturbance.  Respiratory:  Negative for shortness of breath.   Cardiovascular:  Negative for chest pain, palpitations and leg swelling.  Gastrointestinal:  Negative for  nausea and vomiting.  Neurological:  Negative for dizziness and headaches.  All other systems reviewed and are negative.    Physical Exam Triage Vital Signs ED Triage Vitals  Encounter Vitals Group     BP 10/27/23 1652 (!) 222/142     Girls Systolic BP Percentile --      Girls Diastolic BP Percentile --      Boys Systolic BP Percentile --      Boys Diastolic BP Percentile --      Pulse Rate 10/27/23 1652 97     Resp 10/27/23 1652 16     Temp 10/27/23 1652 97.8 F (36.6 C)     Temp Source 10/27/23 1652 Oral     SpO2 10/27/23 1652 97 %     Weight --      Height --      Head Circumference --      Peak Flow --      Pain Score 10/27/23 1650 3     Pain Loc --      Pain Education --      Exclude from Growth Chart --    No data found.  Updated Vital Signs BP (!) 192/129   Pulse 97   Temp 97.8 F (36.6 C) (Oral)   Resp 16   LMP 10/04/2023   SpO2 97%   Visual Acuity Right Eye Distance:   Left Eye Distance:   Bilateral Distance:    Right Eye Near:   Left Eye Near:    Bilateral Near:     Physical Exam Vitals reviewed.  Constitutional:      General: She is awake. She is not in acute distress.    Appearance: Normal appearance. She is well-developed. She is not ill-appearing, toxic-appearing or diaphoretic.  HENT:     Head: Normocephalic.     Right Ear: Hearing normal.     Left Ear: Hearing normal.     Nose: Nose normal.     Mouth/Throat:     Mouth: Mucous membranes are moist.  Eyes:     General: Lids are normal. Vision grossly intact.        Right eye: Hordeolum present. No discharge.     Extraocular Movements: Extraocular movements intact.     Conjunctiva/sclera: Conjunctivae normal.     Right eye: Right conjunctiva is not injected.     Pupils: Pupils are equal, round, and reactive to light.  Cardiovascular:     Rate and Rhythm: Normal rate and regular rhythm.     Heart sounds: Normal heart sounds.  Pulmonary:     Effort: Pulmonary effort is normal.      Breath sounds: Normal breath sounds and air entry.  Musculoskeletal:        General: Normal range of motion.     Cervical back: Full passive range of motion without pain, normal range of motion and neck supple.  Skin:    General: Skin is warm and dry.  Neurological:     General: No focal deficit present.     Mental Status: She is alert and oriented to person, place, and  time.  Psychiatric:        Speech: Speech normal.        Behavior: Behavior is cooperative.      UC Treatments / Results  Labs (all labs ordered are listed, but only abnormal results are displayed) Labs Reviewed - No data to display  EKG   Radiology No results found.  Procedures Procedures (including critical care time)  Medications Ordered in UC Medications  metoprolol  tartrate (LOPRESSOR ) tablet 50 mg (50 mg Oral Given 10/27/23 1708)  cloNIDine  (CATAPRES ) tablet 0.2 mg (0.2 mg Oral Given 10/27/23 1708)    Initial Impression / Assessment and Plan / UC Course  I have reviewed the triage vital signs and the nursing notes.  Pertinent labs & imaging results that were available during my care of the patient were reviewed by me and considered in my medical decision making (see chart for details).    Patient presents with severely elevated blood pressure of 222/142 mmHg. She has a known history of hypertension with a prior hospitalization for hypertensive urgency in 2023 and reports longstanding nonadherence to antihypertensive medications, largely due to inconsistent access to a primary care provider. She now has insurance and is seeking to re-establish care. Her last known labs from 2024 indicated elevated kidney function, raising concern for hypertension-related end-organ damage. Despite critically high blood pressure today, the patient denies current symptoms such as headache, visual disturbances, chest pain, dyspnea, nausea, or neurological symptoms, and appears clinically stable and nontoxic. Given the absence  of acute symptoms and the patient's high-risk history, immediate blood pressure reduction was initiated in clinic with oral metoprolol  50 mg and clonidine  0.2 mg. BP rechecks shows some improvement but still elevated. Patient continues to deny any symptoms. Prescriptions for long-term antihypertensive therapy given. She has also been started on Aspirin  81 mg daily. She requested allergy medication in which cetirizine  has been ordered. Referral made to Dr. Grayce Slocumb, an [place] American female provider, per patient preference, to establish consistent PCP care and ongoing hypertension management. Patient should call ASAP to make appointment.The patient was counseled extensively on the importance of daily medication adherence, routine follow-up, and monitoring for symptoms of hypertensive emergency.  In addition, the patient reports swelling and redness of the lower right eyelid. On exam, she was diagnosed with an internal hordeolum. Erythromycin  ophthalmic ointment was prescribed for topical application to the affected area. The patient was instructed to avoid touching or squeezing the eyelid, to apply warm compresses, and to follow up if symptoms worsen or fail to improve.  Today's evaluation has revealed no signs of a dangerous process. Discussed diagnosis with patient and/or guardian. Patient and/or guardian aware of their diagnosis, possible red flag symptoms to watch out for and need for close follow up. Patient and/or guardian understands verbal and written discharge instructions. Patient and/or guardian comfortable with plan and disposition.  Patient and/or guardian has a clear mental status at this time, good insight into illness (after discussion and teaching) and has clear judgment to make decisions regarding their care  Documentation was completed with the aid of voice recognition software. Transcription may contain typographical errors. Final Clinical Impressions(s) / UC Diagnoses   Final  diagnoses:  Asymptomatic hypertensive urgency  Hordeolum internum of right lower eyelid  Personal history of nonadherence to medical treatment     Discharge Instructions      You were treated today for severely elevated blood pressure, which reached 222/142 mmHg. Although you currently have no symptoms such as headache, vision changes,  chest pain, shortness of breath, or neurological issues, your blood pressure is dangerously high and places you at risk for serious complications such as stroke, heart attack, or kidney damage. You were given medication in the clinic to help lower your blood pressure, including metoprolol  and clonidine , and you have been started on a long-term antihypertensive regimen, along with daily low-dose aspirin . Your blood pressure has improved slightly but remains elevated, and close monitoring is critical. You now have insurance and were referred to Dr. Grayce Slocumb, a primary care provider who aligns with your preference for a Black female physician. It is important that you call as soon as possible to schedule an appointment and begin regular follow-up care. Daily medication adherence is essential for managing your blood pressure and preventing complications. You should monitor for warning signs such as severe headache, blurred vision, chest pain, shortness of breath, numbness, or confusion. Seek emergency care immediately if any of these occur.  You were also evaluated for redness and swelling of your lower right eyelid and diagnosed with an internal hordeolum (stye). Erythromycin  ointment was prescribed for topical use. Apply it to the affected area as directed, avoid touching or squeezing the eyelid, and use warm compresses several times a day to help with drainage. Follow up with your provider if the swelling worsens, the area becomes increasingly painful or red, or if vision changes develop.      ED Prescriptions     Medication Sig Dispense Auth. Provider    amLODipine  (NORVASC ) 10 MG tablet Take 1 tablet (10 mg total) by mouth daily. 30 tablet Iola Lukes, FNP   losartan -hydrochlorothiazide  (HYZAAR) 100-25 MG tablet Take 1 tablet by mouth daily. 30 tablet Iola Lukes, FNP   nebivolol  (BYSTOLIC ) 10 MG tablet Take 1 tablet (10 mg total) by mouth daily. 30 tablet Iola Lukes, FNP   potassium chloride  SA (KLOR-CON  M) 20 MEQ tablet Take 1 tablet (20 mEq total) by mouth daily. 30 tablet Iola Lukes, FNP   spironolactone  (ALDACTONE ) 25 MG tablet Take 1 tablet (25 mg total) by mouth daily. 30 tablet Iola Lukes, FNP   cetirizine  (ZYRTEC ) 10 MG tablet Take 1 tablet (10 mg total) by mouth daily. 30 tablet Iola Lukes, FNP   aspirin  81 MG chewable tablet Chew 1 tablet (81 mg total) by mouth daily. 30 tablet Iola Lukes, FNP   erythromycin  ophthalmic ointment Place a 1/2 inch ribbon of ointment into the right lower eyelid 4 times a day for 7 days. 3.5 g Iola Lukes, FNP      PDMP not reviewed this encounter.   Iola Lukes, [place] 10/27/23 [phone]

## 2023-10-27 NOTE — Discharge Instructions (Addendum)
 You were treated today for severely elevated blood pressure, which reached 222/142 mmHg. Although you currently have no symptoms such as headache, vision changes, chest pain, shortness of breath, or neurological issues, your blood pressure is dangerously high and places you at risk for serious complications such as stroke, heart attack, or kidney damage. You were given medication in the clinic to help lower your blood pressure, including metoprolol  and clonidine , and you have been started on a long-term antihypertensive regimen, along with daily low-dose aspirin . Your blood pressure has improved slightly but remains elevated, and close monitoring is critical. You now have insurance and were referred to Dr. Grayce Slocumb, a primary care provider who aligns with your preference for a Black female physician. It is important that you call as soon as possible to schedule an appointment and begin regular follow-up care. Daily medication adherence is essential for managing your blood pressure and preventing complications. You should monitor for warning signs such as severe headache, blurred vision, chest pain, shortness of breath, numbness, or confusion. Seek emergency care immediately if any of these occur.  You were also evaluated for redness and swelling of your lower right eyelid and diagnosed with an internal hordeolum (stye). Erythromycin  ointment was prescribed for topical use. Apply it to the affected area as directed, avoid touching or squeezing the eyelid, and use warm compresses several times a day to help with drainage. Follow up with your provider if the swelling worsens, the area becomes increasingly painful or red, or if vision changes develop.

## 2023-10-27 NOTE — ED Triage Notes (Signed)
 Pt c/o right lower eyelid is erythematous and has 2+ swelling started yesterday. Pt denies vision changes. Pt states she works at [employer] and wears 2 pairs of glasses, but the dust makes her rub her eyes.

## 2023-11-24 ENCOUNTER — Telehealth

## 2023-11-24 ENCOUNTER — Telehealth: Admitting: Physician Assistant

## 2023-11-24 DIAGNOSIS — J302 Other seasonal allergic rhinitis: Secondary | ICD-10-CM | POA: Diagnosis not present

## 2023-11-24 MED ORDER — FLUTICASONE PROPIONATE 50 MCG/ACT NA SUSP
2.0000 | Freq: Every day | NASAL | 0 refills | Status: AC
Start: 2023-11-24 — End: ?

## 2023-11-24 MED ORDER — OLOPATADINE HCL 0.1 % OP SOLN: 1.0000 [drp] | Freq: Two times a day (BID) | OPHTHALMIC | 0 refills | Status: AC

## 2023-11-24 NOTE — Progress Notes (Signed)
 E visit for Allergic Rhinitis We are sorry that you are not feeling well.  Here is how we plan to help!  Based on what you have shared with me it looks like you have Allergic Rhinitis.  Rhinitis is when a reaction occurs that causes nasal congestion, runny nose, sneezing, and itching.  Most types of rhinitis are caused by an inflammation and are associated with symptoms in the eyes ears or throat. There are several types of rhinitis.  The most common are acute rhinitis, which is usually caused by a viral illness, allergic or seasonal rhinitis, and nonallergic or year-round rhinitis.  Nasal allergies occur certain times of the year.  Allergic rhinitis is caused when allergens in the air trigger the release of histamine in the body.  Histamine causes itching, swelling, and fluid to build up in the fragile linings of the nasal passages, sinuses and eyelids.  An itchy nose and clear discharge are common.  I recommend the following over the counter treatments: Continue zyrtec  daily  I also would recommend a nasal spray: I have prescribed: Flonase  2 sprays into each nostril once daily  You may also benefit from eye drops such as: I have prescribed: Olopatadine  0.1% eye drops Use 1 drop in each eye twice daily  HOME CARE:  You can use an over-the-counter saline nasal spray as needed Avoid areas where there is heavy dust, mites, or molds Stay indoors on windy days during the pollen season Keep windows closed in home, at least in bedroom; use air conditioner. Use high-efficiency house air filter Keep windows closed in car, turn AC on re-circulate Avoid playing out with dog during pollen season  GET HELP RIGHT AWAY IF:  If your symptoms do not improve within 10 days You become short of breath You develop yellow or green discharge from your nose for over 3 days You have coughing fits  MAKE SURE YOU:  Understand these instructions Will watch your condition Will get help right away if you  are not doing well or get worse  Thank you for choosing an e-visit. Your e-visit answers were reviewed by a board certified advanced clinical practitioner to complete your personal care plan. Depending upon the condition, your plan could have included both over the counter or prescription medications. Please review your pharmacy choice. Be sure that the pharmacy you have chosen is open so that you can pick up your prescription now.  If there is a problem you may message your provider in MyChart to have the prescription routed to another pharmacy. Your safety is important to us . If you have drug allergies check your prescription carefully.  For the next 24 hours, you can use MyChart to ask questions about today's visit, request a non-urgent call back, or ask for a work or school excuse from your e-visit provider. You will get an email in the next two days asking about your experience. I hope that your e-visit has been valuable and will speed your recovery.       I have spent 5 minutes in review of e-visit questionnaire, review and updating patient chart, medical decision making and response to patient.   Delon CHRISTELLA Dickinson, PA-C

## 2023-11-25 ENCOUNTER — Telehealth: Admitting: Physician Assistant

## 2023-11-25 DIAGNOSIS — Z760 Encounter for issue of repeat prescription: Secondary | ICD-10-CM

## 2023-11-25 MED ORDER — NEBIVOLOL HCL 10 MG PO TABS: 10.0000 mg | ORAL_TABLET | Freq: Every day | ORAL | 1 refills | Status: DC

## 2023-11-25 MED ORDER — SPIRONOLACTONE 25 MG PO TABS: 25.0000 mg | ORAL_TABLET | Freq: Every day | ORAL | 1 refills | Status: DC

## 2023-11-25 MED ORDER — AMLODIPINE BESYLATE 10 MG PO TABS
10.0000 mg | ORAL_TABLET | Freq: Every day | ORAL | 1 refills | Status: DC
Start: 2023-11-25 — End: 2024-01-12

## 2023-11-25 MED ORDER — CETIRIZINE HCL 10 MG PO TABS: 10.0000 mg | ORAL_TABLET | Freq: Every day | ORAL | 1 refills | Status: DC

## 2023-11-25 MED ORDER — LOSARTAN POTASSIUM-HCTZ 100-25 MG PO TABS: 1.0000 | ORAL_TABLET | Freq: Every day | ORAL | 1 refills | Status: DC

## 2023-11-25 NOTE — Progress Notes (Signed)
 Virtual Visit Consent   Bethany Ball , you are scheduled for a virtual visit with a [facility] provider today. Just as with appointments in the office, your consent must be obtained to participate. Your consent will be active for this visit and any virtual visit you may have with one of our providers in the next 365 days. If you have a MyChart account, a copy of this consent can be sent to you electronically.  As this is a virtual visit, video technology does not allow for your provider to perform a traditional examination. This may limit your provider's ability to fully assess your condition. If your provider identifies any concerns that need to be evaluated in person or the need to arrange testing (such as labs, EKG, etc.), we will make arrangements to do so. Although advances in technology are sophisticated, we cannot ensure that it will always work on either your end or our end. If the connection with a video visit is poor, the visit may have to be switched to a telephone visit. With either a video or telephone visit, we are not always able to ensure that we have a secure connection.  By engaging in this virtual visit, you consent to the provision of healthcare and authorize for your insurance to be billed (if applicable) for the services provided during this visit. Depending on your insurance coverage, you may receive a charge related to this service.  I need to obtain your verbal consent now. Are you willing to proceed with your visit today? Bethany Ball  has provided verbal consent on 11/25/2023 for a virtual visit (video or telephone). Bethany Ball, [place]  Date: 11/25/2023 9:27 AM   Virtual Visit via Video Note   I, Bethany Ball, connected with  Bethany Ball   ([id number], 1991-03-06) on 11/25/23 at  9:00 AM EDT by a video-enabled telemedicine application and verified that I am speaking with the correct person using two identifiers.  Location: Patient: Virtual  Visit Location Patient: Home Provider: Virtual Visit Location Provider: Home Office   I discussed the limitations of evaluation and management by telemedicine and the availability of in person appointments. The patient expressed understanding and agreed to proceed.    History of Present Illness: Bethany Ball  is a 33 y.o. who identifies as a female who was assigned female at birth, and is being seen today for medication refill request.  Patient currently awaiting PCP appt (scheduled for October 1). Was recently evaluated at [facility] for uncontrolled hypertension due to not being able to take her medications prior (lack of insurance). Was noted to have extremely elevated BP at 222/142, asymptomatic. Was given Metoprolol  and Clonidine  in office with improvement in BP. Discharged home on her previous medication regimen of amlodipine  10 mg daily, Nebivolol  10 mg daily, Spironolactone  25 mg daily, and losartan -hydrochlorothiazide  100-25 mg daily. Patient is taking medications as directed. Patient denies chest pain, palpitations, lightheadedness, dizziness, or frequent headaches.  Tolerating medications well. Has been checking BP at home. Notes BP is much improved averaging around 140/80s.  Is hydrating well.      HPI: HPI  Problems:  Patient Active Problem List   Diagnosis Date Noted   Severe hypertension 06/13/2021   Severe uncontrolled hypertension 06/12/2021    Allergies: No Known Allergies Medications:  Current Outpatient Medications:    amLODipine  (NORVASC ) 10 MG tablet, Take 1 tablet (10 mg total) by mouth daily., Disp: 30 tablet, Rfl: 1   aspirin  81 MG chewable tablet, Chew 1 tablet (81 mg total)  by mouth daily., Disp: 30 tablet, Rfl: 0   cetirizine  (ZYRTEC ) 10 MG tablet, Take 1 tablet (10 mg total) by mouth daily., Disp: 30 tablet, Rfl: 1   fluticasone  (FLONASE ) 50 MCG/ACT nasal spray, Place 2 sprays into both nostrils daily., Disp: 16 g, Rfl: 0   losartan -hydrochlorothiazide  (HYZAAR)  100-25 MG tablet, Take 1 tablet by mouth daily., Disp: 30 tablet, Rfl: 1   nebivolol  (BYSTOLIC ) 10 MG tablet, Take 1 tablet (10 mg total) by mouth daily., Disp: 30 tablet, Rfl: 1   olopatadine  (PATANOL) 0.1 % ophthalmic solution, Place 1 drop into both eyes 2 (two) times daily., Disp: 5 mL, Rfl: 0   spironolactone  (ALDACTONE ) 25 MG tablet, Take 1 tablet (25 mg total) by mouth daily., Disp: 30 tablet, Rfl: 1  Observations/Objective: Patient is well-developed, well-nourished in no acute distress.  Resting comfortably  at home.  Head is normocephalic, atraumatic.  No labored breathing.  Speech is clear and coherent with logical content.  Patient is alert and oriented at baseline.   Assessment and Plan: 1. Encounter for medication refill (Primary) - amLODipine  (NORVASC ) 10 MG tablet; Take 1 tablet (10 mg total) by mouth daily.  Dispense: 30 tablet; Refill: 1 - cetirizine  (ZYRTEC ) 10 MG tablet; Take 1 tablet (10 mg total) by mouth daily.  Dispense: 30 tablet; Refill: 1 - losartan -hydrochlorothiazide  (HYZAAR) 100-25 MG tablet; Take 1 tablet by mouth daily.  Dispense: 30 tablet; Refill: 1 - nebivolol  (BYSTOLIC ) 10 MG tablet; Take 1 tablet (10 mg total) by mouth daily.  Dispense: 30 tablet; Refill: 1 - spironolactone  (ALDACTONE ) 25 MG tablet; Take 1 tablet (25 mg total) by mouth daily.  Dispense: 30 tablet; Refill: 1  One time refill given until she can get in with PCP for follow-up and repeat labs. Home BP at much improved range. She is to continue monitoring and recording, taking to her new patient appt. ER precautions reviewed.  Follow Up Instructions: I discussed the assessment and treatment plan with the patient. The patient was provided an opportunity to ask questions and all were answered. The patient agreed with the plan and demonstrated an understanding of the instructions.  A copy of instructions were sent to the patient via MyChart unless otherwise noted below.   The patient was advised  to call back or seek an in-person evaluation if the symptoms worsen or if the condition fails to improve as anticipated.    Bethany Velma Lunger, PA-C

## 2023-11-25 NOTE — Patient Instructions (Signed)
 Cosette Suppa , thank you for joining Elsie Velma Lunger, PA-C for today's virtual visit.  While this provider is not your primary care provider (PCP), if your PCP is located in our provider database this encounter information will be shared with them immediately following your visit.   A [facility] MyChart account gives you access to today's visit and all your visits, tests, and labs performed at [facility]  click here if you don't have a [facility] MyChart account or go to [email or web address]  Consent: (Patient) Bethany Ball  provided verbal consent for this virtual visit at the beginning of the encounter.  Current Medications:  Current Outpatient Medications:    amLODipine  (NORVASC ) 10 MG tablet, Take 1 tablet (10 mg total) by mouth daily., Disp: 30 tablet, Rfl: 1   aspirin  81 MG chewable tablet, Chew 1 tablet (81 mg total) by mouth daily., Disp: 30 tablet, Rfl: 0   cetirizine  (ZYRTEC ) 10 MG tablet, Take 1 tablet (10 mg total) by mouth daily., Disp: 30 tablet, Rfl: 1   fluticasone  (FLONASE ) 50 MCG/ACT nasal spray, Place 2 sprays into both nostrils daily., Disp: 16 g, Rfl: 0   losartan -hydrochlorothiazide  (HYZAAR) 100-25 MG tablet, Take 1 tablet by mouth daily., Disp: 30 tablet, Rfl: 1   nebivolol  (BYSTOLIC ) 10 MG tablet, Take 1 tablet (10 mg total) by mouth daily., Disp: 30 tablet, Rfl: 1   olopatadine  (PATANOL) 0.1 % ophthalmic solution, Place 1 drop into both eyes 2 (two) times daily., Disp: 5 mL, Rfl: 0   spironolactone  (ALDACTONE ) 25 MG tablet, Take 1 tablet (25 mg total) by mouth daily., Disp: 30 tablet, Rfl: 1   Medications ordered in this encounter:  Meds ordered this encounter  Medications   amLODipine  (NORVASC ) 10 MG tablet    Sig: Take 1 tablet (10 mg total) by mouth daily.    Dispense:  30 tablet    Refill:  1    Supervising Provider:   LAMPTEY, PHILIP O [[id number]]   cetirizine  (ZYRTEC ) 10 MG tablet    Sig: Take 1 tablet (10 mg total) by  mouth daily.    Dispense:  30 tablet    Refill:  1    Supervising Provider:   LAMPTEY, PHILIP O [[id number]]   losartan -hydrochlorothiazide  (HYZAAR) 100-25 MG tablet    Sig: Take 1 tablet by mouth daily.    Dispense:  30 tablet    Refill:  1    Supervising Provider:   LAMPTEY, PHILIP O [[id number]]   nebivolol  (BYSTOLIC ) 10 MG tablet    Sig: Take 1 tablet (10 mg total) by mouth daily.    Dispense:  30 tablet    Refill:  1    Supervising Provider:   LAMPTEY, PHILIP O [[id number]]   spironolactone  (ALDACTONE ) 25 MG tablet    Sig: Take 1 tablet (25 mg total) by mouth daily.    Dispense:  30 tablet    Refill:  1    Supervising Provider:   LAMPTEY, PHILIP O [phone]     *If you need refills on other medications prior to your next appointment, please contact your pharmacy*  Follow-Up: Call back or seek an in-person evaluation if the symptoms worsen or if the condition fails to improve as anticipated.   Virtual Care [phone]  Other Instructions DASH Eating Plan DASH stands for Dietary Approaches to Stop Hypertension. The DASH eating plan is a healthy eating plan that has been shown to: Lower high blood pressure (hypertension). Reduce your risk for type 2 diabetes, heart  disease, and stroke. Help with weight loss. What are tips for following this plan? Reading food labels Check food labels for the amount of salt (sodium) per serving. Choose foods with less than 5 percent of the Daily Value (DV) of sodium. In general, foods with less than 300 milligrams (mg) of sodium per serving fit into this eating plan. To find whole grains, look for the word whole as the first word in the ingredient list. Shopping Buy products labeled as low-sodium or no salt added. Buy fresh foods. Avoid canned foods and pre-made or frozen meals. Cooking Try not to add salt when you cook. Use salt-free seasonings or herbs instead of table salt or sea salt. Check with your health care provider or  pharmacist before using salt substitutes. Do not fry foods. Cook foods in healthy ways, such as baking, boiling, grilling, roasting, or broiling. Cook using oils that are good for your heart. These include olive, canola, avocado, soybean, and sunflower oil. Meal planning  Eat a balanced diet. This should include: 4 or more servings of fruits and 4 or more servings of vegetables each day. Try to fill half of your plate with fruits and vegetables. 6-8 servings of whole grains each day. 6 or less servings of lean meat, poultry, or fish each day. 1 oz is 1 serving. A 3 oz (85 g) serving of meat is about the same size as the palm of your hand. One egg is 1 oz (28 g). 2-3 servings of low-fat dairy each day. One serving is 1 cup (237 mL). 1 serving of nuts, seeds, or beans 5 times each week. 2-3 servings of heart-healthy fats. Healthy fats called omega-3 fatty acids are found in foods such as walnuts, flaxseeds, fortified milks, and eggs. These fats are also found in cold-water fish, such as sardines, salmon, and mackerel. Limit how much you eat of: Canned or prepackaged foods. Food that is high in trans fat, such as fried foods. Food that is high in saturated fat, such as fatty meat. Desserts and other sweets, sugary drinks, and other foods with added sugar. Full-fat dairy products. Do not salt foods before eating. Do not eat more than 4 egg yolks a week. Try to eat at least 2 vegetarian meals a week. Eat more home-cooked food and less restaurant, buffet, and fast food. Lifestyle When eating at a restaurant, ask if your food can be made with less salt or no salt. If you drink alcohol: Limit how much you have to: 0-1 drink a day if you are female. 0-2 drinks a day if you are female. Know how much alcohol is in your drink. In the [place] one drink is one 12 oz bottle of beer (355 mL), one 5 oz glass of wine (148 mL), or one 1 oz glass of hard liquor (44 mL). General information Avoid eating more  than 2,300 mg of salt a day. If you have hypertension, you may need to reduce your sodium intake to 1,500 mg a day. Work with your provider to stay at a healthy body weight or lose weight. Ask what the best weight range is for you. On most days of the week, get at least 30 minutes of exercise that causes your heart to beat faster. This may include walking, swimming, or biking. Work with your provider or dietitian to adjust your eating plan to meet your specific calorie needs. What foods should I eat? Fruits All fresh, dried, or frozen fruit. Canned fruits that are in their natural  juice and do not have sugar added to them. Vegetables Fresh or frozen vegetables that are raw, steamed, roasted, or grilled. Low-sodium or reduced-sodium tomato and vegetable juice. Low-sodium or reduced-sodium tomato sauce and tomato paste. Low-sodium or reduced-sodium canned vegetables. Grains Whole-grain or whole-wheat bread. Whole-grain or whole-wheat pasta. Brown rice. [place]. Bulgur. Whole-grain and low-sodium cereals. Pita bread. Low-fat, low-sodium crackers. Whole-wheat flour tortillas. Meats and other proteins Skinless chicken or [place]. Ground chicken or [place]. Pork with fat trimmed off. Fish and seafood. Egg whites. Dried beans, peas, or lentils. Unsalted nuts, nut butters, and seeds. Unsalted canned beans. Lean cuts of beef with fat trimmed off. Low-sodium, lean precooked or cured meat, such as sausages or meat loaves. Dairy Low-fat (1%) or fat-free (skim) milk. Reduced-fat, low-fat, or fat-free cheeses. Nonfat, low-sodium ricotta or cottage cheese. Low-fat or nonfat yogurt. Low-fat, low-sodium cheese. Fats and oils Soft margarine without trans fats. Vegetable oil. Reduced-fat, low-fat, or light mayonnaise and salad dressings (reduced-sodium). Canola, safflower, olive, avocado, soybean, and sunflower oils. Avocado. Seasonings and condiments Herbs. Spices. Seasoning mixes without salt. Other  foods Unsalted popcorn and pretzels. Fat-free sweets. The items listed above may not be all the foods and drinks you can have. Talk to a dietitian to learn more. What foods should I avoid? Fruits Canned fruit in a light or heavy syrup. Fried fruit. Fruit in cream or butter sauce. Vegetables Creamed or fried vegetables. Vegetables in a cheese sauce. Regular canned vegetables that are not marked as low-sodium or reduced-sodium. Regular canned tomato sauce and paste that are not marked as low-sodium or reduced-sodium. Regular tomato and vegetable juices that are not marked as low-sodium or reduced-sodium. Dene. Olives. Grains Baked goods made with fat, such as croissants, muffins, or some breads. Dry pasta or rice meal packs. Meats and other proteins Fatty cuts of meat. Ribs. Fried meat. [place]. Bologna, salami, and other precooked or cured meats, such as sausages or meat loaves, that are not lean and low in sodium. Fat from the back of a pig (fatback). Bratwurst. Salted nuts and seeds. Canned beans with added salt. Canned or smoked fish. Whole eggs or egg yolks. Chicken or [place] with skin. Dairy Whole or 2% milk, cream, and half-and-half. Whole or full-fat cream cheese. Whole-fat or sweetened yogurt. Full-fat cheese. Nondairy creamers. Whipped toppings. Processed cheese and cheese spreads. Fats and oils Butter. Stick margarine. Lard. Shortening. Ghee. Bacon fat. Tropical oils, such as coconut, palm kernel, or palm oil. Seasonings and condiments Onion salt, garlic salt, seasoned salt, table salt, and sea salt. Worcestershire sauce. Tartar sauce. Barbecue sauce. Teriyaki sauce. Soy sauce, including reduced-sodium soy sauce. Steak sauce. Canned and packaged gravies. Fish sauce. Oyster sauce. Cocktail sauce. Store-bought horseradish. Ketchup. Mustard. Meat flavorings and tenderizers. Bouillon cubes. Hot sauces. Pre-made or packaged marinades. Pre-made or packaged taco seasonings. Relishes. Regular  salad dressings. Other foods Salted popcorn and pretzels. The items listed above may not be all the foods and drinks you should avoid. Talk to a dietitian to learn more. Where to find more information National Heart, Lung, and Blood Institute (NHLBI): BuffaloDryCleaner.gl American Heart Association (AHA): heart.org Academy of Nutrition and Dietetics: eatright.org National Kidney Foundation (NKF): kidney.org This information is not intended to replace advice given to you by your health care provider. Make sure you discuss any questions you have with your health care provider. Document Revised: 04/16/2022 Document Reviewed: 04/16/2022 Elsevier Patient Education  2024 [employer].   If you have been instructed to have an in-person evaluation today  at a local Urgent Care facility, please use the link below. It will take you to a list of all of our available [facility], including address, phone number and hours of operation. Please do not delay care.  [facility]  If you or a family member do not have a primary care provider, use the link below to schedule a visit and establish care. When you choose a [facility] primary care physician or advanced practice provider, you gain a long-term partner in health. Find a Primary Care Provider  Learn more about [facility]'s in-office and virtual care options: [facility] - Get Care Now

## 2024-01-12 ENCOUNTER — Ambulatory Visit: Admitting: Family Medicine

## 2024-01-12 ENCOUNTER — Encounter: Payer: Self-pay | Admitting: Family Medicine

## 2024-01-12 VITALS — BP 130/88 | HR 69 | Temp 98.6°F | Ht 67.0 in | Wt 258.0 lb

## 2024-01-12 DIAGNOSIS — I10 Essential (primary) hypertension: Secondary | ICD-10-CM | POA: Diagnosis not present

## 2024-01-12 DIAGNOSIS — Z136 Encounter for screening for cardiovascular disorders: Secondary | ICD-10-CM

## 2024-01-12 DIAGNOSIS — Z7689 Persons encountering health services in other specified circumstances: Secondary | ICD-10-CM

## 2024-01-12 DIAGNOSIS — E66813 Obesity, class 3: Secondary | ICD-10-CM | POA: Diagnosis not present

## 2024-01-12 DIAGNOSIS — R0683 Snoring: Secondary | ICD-10-CM | POA: Diagnosis not present

## 2024-01-12 DIAGNOSIS — Z1159 Encounter for screening for other viral diseases: Secondary | ICD-10-CM

## 2024-01-12 DIAGNOSIS — Z6841 Body Mass Index (BMI) 40.0 and over, adult: Secondary | ICD-10-CM | POA: Diagnosis not present

## 2024-01-12 DIAGNOSIS — Z760 Encounter for issue of repeat prescription: Secondary | ICD-10-CM

## 2024-01-12 DIAGNOSIS — Z2821 Immunization not carried out because of patient refusal: Secondary | ICD-10-CM

## 2024-01-12 LAB — POCT URINALYSIS DIP (CLINITEK)
Glucose, UA: NEGATIVE mg/dL
Ketones, POC UA: NEGATIVE mg/dL
Nitrite, UA: NEGATIVE
Spec Grav, UA: >=1.03 — AB (ref 1.010–1.025)
Urobilinogen, UA: 0.2 U/dL
pH, UA: 5.5 (ref 5.0–8.0)

## 2024-01-12 MED ORDER — NEBIVOLOL HCL 10 MG PO TABS: 10.0000 mg | ORAL_TABLET | Freq: Every day | ORAL | 1 refills | Status: DC

## 2024-01-12 MED ORDER — SPIRONOLACTONE 25 MG PO TABS: 25.0000 mg | ORAL_TABLET | Freq: Every day | ORAL | 1 refills | Status: AC

## 2024-01-12 MED ORDER — AMLODIPINE BESYLATE 10 MG PO TABS: 10.0000 mg | ORAL_TABLET | Freq: Every day | ORAL | 1 refills | Status: AC

## 2024-01-12 MED ORDER — CETIRIZINE HCL 10 MG PO TABS: 10.0000 mg | ORAL_TABLET | Freq: Every day | ORAL | 1 refills | Status: AC

## 2024-01-12 MED ORDER — LOSARTAN POTASSIUM-HCTZ 100-25 MG PO TABS: 1.0000 | ORAL_TABLET | Freq: Every day | ORAL | 1 refills | Status: AC

## 2024-01-12 NOTE — Patient Instructions (Signed)
 Hypertension, Adult Hypertension is another name for high blood pressure. High blood pressure forces your heart to work harder to pump blood. This can cause problems over time. There are two numbers in a blood pressure reading. There is a top number (systolic) over a bottom number (diastolic). It is best to have a blood pressure that is below 120/80. What are the causes? The cause of this condition is not known. Some other conditions can lead to high blood pressure. What increases the risk? Some lifestyle factors can make you more likely to develop high blood pressure: Smoking. Not getting enough exercise or physical activity. Being overweight. Having too much fat, sugar, calories, or salt (sodium) in your diet. Drinking too much alcohol. Other risk factors include: Having any of these conditions: Heart disease. Diabetes. High cholesterol. Kidney disease. Obstructive sleep apnea. Having a family history of high blood pressure and high cholesterol. Age. The risk increases with age. Stress. What are the signs or symptoms? High blood pressure may not cause symptoms. Very high blood pressure (hypertensive crisis) may cause: Headache. Fast or uneven heartbeats (palpitations). Shortness of breath. Nosebleed. Vomiting or feeling like you may vomit (nauseous). Changes in how you see. Very bad chest pain. Feeling dizzy. Seizures. How is this treated? This condition is treated by making healthy lifestyle changes, such as: Eating healthy foods. Exercising more. Drinking less alcohol. Your doctor may prescribe medicine if lifestyle changes do not help enough and if: Your top number is above 130. Your bottom number is above 80. Your personal target blood pressure may vary. Follow these instructions at home: Eating and drinking  If told, follow the DASH eating plan. To follow this plan: Fill one half of your plate at each meal with fruits and vegetables. Fill one fourth of your plate  at each meal with whole grains. Whole grains include whole-wheat pasta, brown rice, and whole-grain bread. Eat or drink low-fat dairy products, such as skim milk or low-fat yogurt. Fill one fourth of your plate at each meal with low-fat (lean) proteins. Low-fat proteins include fish, chicken without skin, eggs, beans, and tofu. Avoid fatty meat, cured and processed meat, or chicken with skin. Avoid pre-made or processed food. Limit the amount of salt in your diet to less than 1,500 mg each day. Do not drink alcohol if: Your doctor tells you not to drink. You are pregnant, may be pregnant, or are planning to become pregnant. If you drink alcohol: Limit how much you have to: 0-1 drink a day for women. 0-2 drinks a day for men. Know how much alcohol is in your drink. In the [place] one drink equals one 12 oz bottle of beer (355 mL), one 5 oz glass of wine (148 mL), or one 1 oz glass of hard liquor (44 mL). Lifestyle  Work with your doctor to stay at a healthy weight or to lose weight. Ask your doctor what the best weight is for you. Get at least 30 minutes of exercise that causes your heart to beat faster (aerobic exercise) most days of the week. This may include walking, swimming, or biking. Get at least 30 minutes of exercise that strengthens your muscles (resistance exercise) at least 3 days a week. This may include lifting weights or doing Pilates. Do not smoke or use any products that contain nicotine or tobacco. If you need help quitting, ask your doctor. Check your blood pressure at home as told by your doctor. Keep all follow-up visits. Medicines Take over-the-counter and prescription medicines  only as told by your doctor. Follow directions carefully. Do not skip doses of blood pressure medicine. The medicine does not work as well if you skip doses. Skipping doses also puts you at risk for problems. Ask your doctor about side effects or reactions to medicines that you should watch  for. Contact a doctor if: You think you are having a reaction to the medicine you are taking. You have headaches that keep coming back. You feel dizzy. You have swelling in your ankles. You have trouble with your vision. Get help right away if: You get a very bad headache. You start to feel mixed up (confused). You feel weak or numb. You feel faint. You have very bad pain in your: Chest. Belly (abdomen). You vomit more than once. You have trouble breathing. These symptoms may be an emergency. Get help right away. Call 911. Do not wait to see if the symptoms will go away. Do not drive yourself to the hospital. Summary Hypertension is another name for high blood pressure. High blood pressure forces your heart to work harder to pump blood. For most people, a normal blood pressure is less than 120/80. Making healthy choices can help lower blood pressure. If your blood pressure does not get lower with healthy choices, you may need to take medicine. This information is not intended to replace advice given to you by your health care provider. Make sure you discuss any questions you have with your health care provider. Document Revised: 01/16/2021 Document Reviewed: 01/16/2021 Elsevier Patient Education  2024 [employer].

## 2024-01-12 NOTE — Progress Notes (Signed)
 I,Jameka J Llittleton, CMA,acting as a Neurosurgeon for [employer], NP.,have documented all relevant documentation on the behalf of Bruna Creighton, NP,as directed by  Bruna Creighton, NP while in the presence of Bruna Creighton, NP.  Subjective:  Patient ID: Bethany Ball  , female    DOB: 06/08/1990 , 33 y.o.   MRN: [id number]  Chief Complaint  Patient presents with   Establish Care    Patient presents today to establish primary care. Patient previous pcp left and she needs a new one.    Hypertension    Patient would like to be seen for her bp. She reports compliance with her meds. Patient denies having chest pain, sob or headaches at this time. She reports her blood pressure is always high.     HPI Discussed the use of AI scribe software for clinical note transcription with the patient, who gave verbal consent to proceed.  History of Present Illness    Bethany Ball  is a 33 year old female with hypertension who presents to establish care and address blood pressure issues.  Hypertension was first diagnosed at the age of 85 and initially managed with dietary changes. By her senior year of high school, she was started on diuretics. Over the years, her blood pressure has remained difficult to control, with readings as high as 250/100 mmHg when unmedicated. On her current medication regimen, her blood pressure typically ranges from 140-145/95 mmHg. She is currently taking losartan  with hydrochlorothiazide , Bystolic , and spironolactone . Despite this, her blood pressure was recently recorded at 213/133 mmHg during an urgent care visit after she had not taken her medication for about a week due to needing a refill. No symptoms such as headaches or fatigue are experienced, even when her blood pressure is elevated.  She has a history of smoking, which she is attempting to quit. 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 She  reports snoring and has been informed of excess fluid behind her eyes during eye exams, which is monitored annually.  Her past medical evaluations include a kidney ultrasound during a hospital stay, which showed no abnormalities. She has not had recent blood work and is not currently under the care of a cardiologist or attending a hypertension clinic.  Her BMI is 40,She is encouraged to strive for BMI less than 30 to decrease cardiac risk. Advised to aim for at least 150 minutes of exercise per week.     Hypertension This is a chronic problem. The current episode started more than 1 year ago. The problem has been waxing and waning since onset. The problem is uncontrolled. Risk factors for coronary artery disease include obesity, sedentary lifestyle and smoking/tobacco exposure. Past treatments include beta blockers, angiotensin blockers, calcium channel blockers and diuretics. The current treatment provides mild improvement. Compliance problems include medication side effects, diet and psychosocial issues.  Identifiable causes of hypertension include a hypertension causing med.     Past Medical History:  Diagnosis Date   Hypertension    does not take her meds that are prescribed     Family History  Problem Relation Age of Onset   Hypertension Mother    Diabetes Father    Hypertension Father    Stroke Father    Heart disease Maternal Grandmother    Cancer Maternal Grandfather    Hypertension Paternal Grandmother    Drug abuse Paternal Grandmother      Current Outpatient Medications:  fluticasone  (FLONASE ) 50 MCG/ACT nasal spray, Place 2 sprays into both nostrils daily., Disp: 16 g, Rfl: 0   amLODipine  (NORVASC ) 10 MG tablet, Take 1 tablet (10 mg total) by mouth daily., Disp: 90 tablet, Rfl: 1   aspirin  81 MG chewable tablet, Chew 1 tablet (81 mg total) by mouth daily., Disp: 30 tablet, Rfl: 0   cetirizine  (ZYRTEC ) 10 MG tablet, Take 1 tablet (10 mg total) by mouth daily., Disp: 90  tablet, Rfl: 1   losartan -hydrochlorothiazide  (HYZAAR) 100-25 MG tablet, Take 1 tablet by mouth daily., Disp: 90 tablet, Rfl: 1   nebivolol  (BYSTOLIC ) 10 MG tablet, Take 1 tablet (10 mg total) by mouth daily., Disp: 90 tablet, Rfl: 1   olopatadine  (PATANOL) 0.1 % ophthalmic solution, Place 1 drop into both eyes 2 (two) times daily., Disp: 5 mL, Rfl: 0   spironolactone  (ALDACTONE ) 25 MG tablet, Take 1 tablet (25 mg total) by mouth daily., Disp: 90 tablet, Rfl: 1   No Known Allergies   Review of Systems  Constitutional: Negative.   Eyes: Negative.   Respiratory: Negative.    Cardiovascular: Negative.   Gastrointestinal: Negative.   Endocrine: Negative for polydipsia, polyphagia and polyuria.  Genitourinary: Negative.   Musculoskeletal: Negative.   Skin: Negative.   Psychiatric/Behavioral: Negative.       Today's Vitals   01/12/24 1415  BP: 130/88  Pulse: 69  Temp: 98.6 F (37 C)  TempSrc: Oral  Weight: 258 lb (117 kg)  Height: 5' 7 (1.702 m)  PainSc: 0-No pain   Body mass index is 40.41 kg/m.  Wt Readings from Last 3 Encounters:  01/12/24 258 lb (117 kg)  09/07/22 222 lb (100.7 kg)  07/17/21 222 lb 4 oz (100.8 kg)    The ASCVD Risk score (Arnett DK, et al., 2019) failed to calculate for the following reasons:   The 2019 ASCVD risk score is only valid for ages 4 to 9  Objective:  Physical Exam HENT:     Head: Normocephalic.  Cardiovascular:     Rate and Rhythm: Bradycardia present.  Pulmonary:     Effort: Pulmonary effort is normal.     Breath sounds: Normal breath sounds.  Abdominal:     General: Bowel sounds are normal.  Skin:    General: Skin is warm and dry.  Neurological:     Mental Status: She is alert and oriented to person, place, and time. Mental status is at baseline.  Psychiatric:        Mood and Affect: Mood normal.        Behavior: Behavior normal.         Assessment And Plan:  Establishing care with new doctor, encounter for  Encounter  for hepatitis C screening test for low risk patient -     Hepatitis C antibody  Influenza vaccination declined  Pneumococcal vaccination declined  Uncontrolled hypertension -     POCT URINALYSIS DIP (CLINITEK) -     Microalbumin / creatinine urine ratio -     EKG 12-Lead -     CBC -     CMP14+EGFR -     Ambulatory referral to [facility] -     amLODIPine  Besylate; Take 1 tablet (10 mg total) by mouth daily.  Dispense: 90 tablet; Refill: 1 -     Losartan  Potassium-HCTZ; Take 1 tablet by mouth daily.  Dispense: 90 tablet; Refill: 1 -     Nebivolol  HCl; Take 1 tablet (10 mg total) by mouth daily.  Dispense: 90 tablet; Refill: 1 -     Spironolactone ; Take 1 tablet (25 mg total) by mouth daily.  Dispense: 90 tablet; Refill: 1  Encounter for screening for cardiovascular disorders -     Lipid panel  Loud snoring -     Ambulatory referral to Cardiology  Encounter for medication refill -     Cetirizine  HCl; Take 1 tablet (10 mg total) by mouth daily.  Dispense: 90 tablet; Refill: 1  Class 3 severe obesity due to excess calories with serious comorbidity and body mass index (BMI) of 40.0 to 44.9 in adult [facility])    Return in 3 months (on 04/13/2024) for Uncontrolled BP check-3/4 months.  Patient was given opportunity to ask questions. Patient verbalized understanding of the plan and was able to repeat key elements of the plan. All questions were answered to their satisfaction.    I, Bruna Creighton, NP, have reviewed all documentation for this visit. The documentation on 01/23/2024 for the exam, diagnosis, procedures, and orders are all accurate and complete.     IF YOU HAVE BEEN REFERRED TO A SPECIALIST, IT MAY TAKE 1-2 WEEKS TO SCHEDULE/PROCESS THE REFERRAL. IF YOU HAVE NOT HEARD FROM US /SPECIALIST IN TWO WEEKS, PLEASE GIVE US  A CALL AT [phone] X [phone].

## 2024-01-13 LAB — CMP14+EGFR
ALT: 17 IU/L (ref 0–32)
AST: 20 IU/L (ref 0–40)
Albumin: 4.6 g/dL (ref 3.9–4.9)
Alkaline Phosphatase: 85 IU/L (ref 41–116)
BUN/Creatinine Ratio: 18 (ref 9–23)
BUN: 21 mg/dL — ABNORMAL HIGH (ref 6–20)
Bilirubin Total: <0.2 mg/dL (ref 0.0–1.2)
CO2: 17 mmol/L — ABNORMAL LOW (ref 20–29)
Calcium: 9.8 mg/dL (ref 8.7–10.2)
Chloride: 98 mmol/L (ref 96–106)
Creatinine, Ser: 1.15 mg/dL — ABNORMAL HIGH (ref 0.57–1.00)
Globulin, Total: 2.8 g/dL (ref 1.5–4.5)
Glucose: 87 mg/dL (ref 70–99)
Potassium: 4.6 mmol/L (ref 3.5–5.2)
Sodium: 134 mmol/L (ref 134–144)
Total Protein: 7.4 g/dL (ref 6.0–8.5)
eGFR: 64 mL/min/1.73 (ref >59)

## 2024-01-13 LAB — LIPID PANEL
Chol/HDL Ratio: 5.4 ratio — ABNORMAL HIGH (ref 0.0–4.4)
Cholesterol, Total: 217 mg/dL — ABNORMAL HIGH (ref 100–199)
HDL: 40 mg/dL (ref >39)
LDL Chol Calc (NIH): 129 mg/dL — ABNORMAL HIGH (ref 0–99)
Triglycerides: 270 mg/dL — ABNORMAL HIGH (ref 0–149)
VLDL Cholesterol Cal: 48 mg/dL — ABNORMAL HIGH (ref 5–40)

## 2024-01-13 LAB — MICROALBUMIN / CREATININE URINE RATIO
Creatinine, Urine: 240.6 mg/dL
Microalb/Creat Ratio: 9 mg/g{creat} (ref 0–29)
Microalbumin, Urine: 21.7 ug/mL

## 2024-01-13 LAB — CBC
Hematocrit: 39 % (ref 34.0–46.6)
Hemoglobin: 12.5 g/dL (ref 11.1–15.9)
MCH: 29 pg (ref 26.6–33.0)
MCHC: 32.1 g/dL (ref 31.5–35.7)
MCV: 91 fL (ref 79–97)
Platelets: 382 x10E3/uL (ref 150–450)
RBC: 4.31 x10E6/uL (ref 3.77–5.28)
RDW: 15.5 % — ABNORMAL HIGH (ref 11.7–15.4)
WBC: 11.9 x10E3/uL — ABNORMAL HIGH (ref 3.4–10.8)

## 2024-01-13 LAB — HEPATITIS C ANTIBODY: Hep C Virus Ab: NONREACTIVE

## 2024-01-25 ENCOUNTER — Ambulatory Visit: Payer: Self-pay | Admitting: Family Medicine

## 2024-01-25 DIAGNOSIS — R0683 Snoring: Secondary | ICD-10-CM | POA: Insufficient documentation

## 2024-01-25 DIAGNOSIS — E66813 Obesity, class 3: Secondary | ICD-10-CM | POA: Insufficient documentation

## 2024-01-25 DIAGNOSIS — Z760 Encounter for issue of repeat prescription: Secondary | ICD-10-CM | POA: Insufficient documentation

## 2024-01-25 NOTE — Progress Notes (Signed)
 Your labs are stable but your BUN is elevated which can be an indication of dehydration. Your cholesterol levels are very elevated, low fat diet and exercise is highly advised. We will recheck your cholesterol levels in 6 months .   Thank you!

## 2024-04-11 ENCOUNTER — Encounter (HOSPITAL_BASED_OUTPATIENT_CLINIC_OR_DEPARTMENT_OTHER): Payer: Self-pay

## 2024-04-12 ENCOUNTER — Institutional Professional Consult (permissible substitution) (HOSPITAL_BASED_OUTPATIENT_CLINIC_OR_DEPARTMENT_OTHER): Payer: Self-pay | Admitting: Family

## 2024-04-19 ENCOUNTER — Ambulatory Visit: Payer: Self-pay | Admitting: Family Medicine

## 2024-04-20 ENCOUNTER — Other Ambulatory Visit: Payer: Self-pay

## 2024-04-20 ENCOUNTER — Other Ambulatory Visit: Payer: Self-pay | Admitting: Family Medicine

## 2024-04-20 DIAGNOSIS — I10 Essential (primary) hypertension: Secondary | ICD-10-CM

## 2024-04-20 MED ORDER — NEBIVOLOL HCL 10 MG PO TABS
10.0000 mg | ORAL_TABLET | Freq: Every day | ORAL | 1 refills | Status: AC
  Filled 2024-04-20: qty 90, 90d supply, fill #0
